# Patient Record
Sex: Female | Born: 1970 | Race: Black or African American | Hispanic: No | Smoking: Never smoker
Health system: Southern US, Community
[De-identification: ages and names within clinical notes are randomized; demographics above are authoritative.]

## PROBLEM LIST (undated history)

## (undated) DIAGNOSIS — C801 Malignant (primary) neoplasm, unspecified: Secondary | ICD-10-CM

## (undated) DIAGNOSIS — N951 Menopausal and female climacteric states: Secondary | ICD-10-CM

## (undated) DIAGNOSIS — G43909 Migraine, unspecified, not intractable, without status migrainosus: Secondary | ICD-10-CM

## (undated) HISTORY — PX: TONSILLECTOMY: SUR1361

## (undated) HISTORY — PX: BREAST SURGERY: SHX581

## (undated) HISTORY — DX: Migraine, unspecified, not intractable, without status migrainosus: G43.909

## (undated) HISTORY — DX: Menopausal and female climacteric states: N95.1

## (undated) HISTORY — PX: TUBAL LIGATION: SHX77

---

## 2002-09-06 HISTORY — PX: BUNIONECTOMY: SHX129

## 2014-11-21 ENCOUNTER — Encounter: Payer: Self-pay | Admitting: Emergency Medicine

## 2014-11-21 ENCOUNTER — Emergency Department (INDEPENDENT_AMBULATORY_CARE_PROVIDER_SITE_OTHER)
Admission: EM | Admit: 2014-11-21 | Discharge: 2014-11-21 | Disposition: A | Discharge status: Home / Self Care | Payer: BLUE CROSS/BLUE SHIELD | Source: Home / Self Care | Attending: Emergency Medicine | Admitting: Emergency Medicine

## 2014-11-21 DIAGNOSIS — R3589 Other polyuria: Secondary | ICD-10-CM

## 2014-11-21 DIAGNOSIS — N3001 Acute cystitis with hematuria: Secondary | ICD-10-CM

## 2014-11-21 DIAGNOSIS — R358 Other polyuria: Secondary | ICD-10-CM | POA: Diagnosis not present

## 2014-11-21 LAB — POCT URINALYSIS DIP (MANUAL ENTRY)
Bilirubin, UA: NEGATIVE
Glucose, UA: NEGATIVE
Ketones, POC UA: NEGATIVE
Nitrite, UA: NEGATIVE
Protein Ur, POC: NEGATIVE
Spec Grav, UA: 1.02
Urobilinogen, UA: 0.2
pH, UA: 5.5

## 2014-11-21 LAB — POCT URINE PREGNANCY: Preg Test, Ur: NEGATIVE

## 2014-11-21 MED ORDER — CIPROFLOXACIN HCL 500 MG PO TABS: 500.0000 mg | ORAL_TABLET | Freq: Two times a day (BID) | ORAL | Status: DC

## 2014-11-21 NOTE — ED Provider Notes (Signed)
CSN: [id number]     Arrival date & time 11/21/14  1956 History   None    Chief Complaint  Patient presents with  . Back Pain   (Consider location/radiation/quality/duration/timing/severity/associated sxs/prior Treatment) HPI This is a 44 y.o. female who presents today with UTI symptoms for 1 day  + Mild dysuria + Moderate to severe urinary frequency + urgency No hematuria No vaginal discharge No fever/chills Mild right flank and suprapubic pain. No nausea No vomiting No CVA back pain, but she feels a dull ache there.(Which is similar to when she's had prior UTIs) No fatigue She denies chance of pregnancy, as she is status post BTL, but she states LMP 10/10/2014. For the past 6 months, periods are somewhat irregular, about every 5-6 weeks.--- After discussion, She requests a urine pregnancy test today. Has tried over-the-counter measures without improvement.    History reviewed. No pertinent past medical history. Past Surgical History  Procedure Laterality Date  . Cesarean section     Family History  Problem Relation Age of Onset  . Hypertension Mother    History  Substance Use Topics  . Smoking status: Never Smoker   . Smokeless tobacco: Not on file  . Alcohol Use: No   OB History    No data available     Review of Systems  All other systems reviewed and are negative.   Allergies  Review of patient's allergies indicates no known allergies.  Home Medications   Prior to Admission medications   Medication Sig Start Date End Date Taking? Authorizing Provider  ciprofloxacin (CIPRO) 500 MG tablet Take 1 tablet (500 mg total) by mouth 2 (two) times daily. For 7 days 11/21/14   Jacqulyn Cane, MD   BP 147/88 mmHg  Pulse 85  Temp(Src) 98.5 F (36.9 C) (Oral)  Ht 5' (1.524 m)  Wt 159 lb (72.122 kg)  BMI 31.05 kg/m2  SpO2 100%  LMP 10/10/2014 Physical Exam  Constitutional: She is oriented to person, place, and time. She appears well-developed and well-nourished. No  distress.  HENT:  Mouth/Throat: Oropharynx is clear and moist.  Eyes: No scleral icterus.  Neck: Neck supple.  Cardiovascular: Normal rate, regular rhythm and normal heart sounds.   Pulmonary/Chest: Breath sounds normal.  Abdominal: Soft. She exhibits no mass. There is no hepatosplenomegaly. There is tenderness in the suprapubic area. There is no rebound, no guarding and no CVA tenderness.  Minimal right flank tenderness but no definite CVA tenderness  Lymphadenopathy:    She has no cervical adenopathy.  Neurological: She is alert and oriented to person, place, and time.  Skin: Skin is warm and dry.  Nursing note and vitals reviewed.   ED Course  Procedures (including critical care time) Labs Review Labs Reviewed  URINE CULTURE  POCT URINALYSIS DIP (MANUAL ENTRY)  POCT URINE PREGNANCY   Results for orders placed or performed during the hospital encounter of 11/21/14  POCT urinalysis dipstick (new)  Result Value Ref Range   Color, UA yellow    Clarity, UA clear    Glucose, UA neg    Bilirubin, UA negative    Bilirubin, UA negative    Spec Grav, UA 1.020    Blood, UA moderate    pH, UA 5.5    Protein Ur, POC negative    Urobilinogen, UA 0.2    Nitrite, UA Negative    Leukocytes, UA    POCT urine pregnancy  Result Value Ref Range   Preg Test, Ur Negative  MDM   1. Polyuria   2. Acute cystitis with hematuria    Urine pregnancy test negative. Clinically, no definite evidence for pyelonephritis.--But she has history of recurrent UTIs in the past. Treatment options discussed, as well as risks, benefits, alternatives. Patient voiced understanding and agreement with the following plans: Urine culture sent Cipro 500 mg twice a day 7 days Other symptomatic care discussed. Follow-up with your primary care doctor in 5-7 days if not improving, or sooner if symptoms become worse. She declined urology referral at this time, but advised to discuss with her PCP at recheck ,  to consider referral to urologist because of recurrent UTIs. Precautions discussed. Red flags discussed. Questions invited and answered. Patient voiced understanding and agreement.     Jacqulyn Cane, MD 11/21/14 2026

## 2014-11-21 NOTE — ED Notes (Signed)
Rt kidney pain since last night, polyuria, wants pregnancy test

## 2014-11-26 LAB — URINE CULTURE: Colony Count: 15000

## 2014-11-28 ENCOUNTER — Telehealth: Payer: Self-pay | Admitting: Emergency Medicine

## 2014-11-28 NOTE — ED Notes (Signed)
Inquired about patient's status; encourage them to call with questions/concerns.  

## 2014-12-18 ENCOUNTER — Ambulatory Visit: Payer: BLUE CROSS/BLUE SHIELD | Admitting: Physician Assistant

## 2015-09-25 ENCOUNTER — Ambulatory Visit (INDEPENDENT_AMBULATORY_CARE_PROVIDER_SITE_OTHER): Payer: BLUE CROSS/BLUE SHIELD

## 2015-09-25 ENCOUNTER — Encounter: Payer: Self-pay | Admitting: Osteopathic Medicine

## 2015-09-25 ENCOUNTER — Other Ambulatory Visit: Payer: Self-pay | Admitting: Osteopathic Medicine

## 2015-09-25 ENCOUNTER — Ambulatory Visit (INDEPENDENT_AMBULATORY_CARE_PROVIDER_SITE_OTHER): Payer: BLUE CROSS/BLUE SHIELD | Admitting: Osteopathic Medicine

## 2015-09-25 ENCOUNTER — Ambulatory Visit (HOSPITAL_BASED_OUTPATIENT_CLINIC_OR_DEPARTMENT_OTHER): Payer: BLUE CROSS/BLUE SHIELD

## 2015-09-25 VITALS — BP 136/84 | HR 71 | Ht 60.0 in | Wt 168.0 lb

## 2015-09-25 DIAGNOSIS — Z6832 Body mass index (BMI) 32.0-32.9, adult: Secondary | ICD-10-CM

## 2015-09-25 DIAGNOSIS — M25561 Pain in right knee: Secondary | ICD-10-CM | POA: Diagnosis not present

## 2015-09-25 DIAGNOSIS — M25551 Pain in right hip: Secondary | ICD-10-CM

## 2015-09-25 MED ORDER — MELOXICAM 7.5 MG PO TABS: 7.5000 mg | ORAL_TABLET | Freq: Two times a day (BID) | ORAL | Status: DC | PRN

## 2015-09-25 MED ORDER — PHENTERMINE HCL 30 MG PO CAPS: 30.0000 mg | ORAL_CAPSULE | ORAL | Status: DC

## 2015-09-25 NOTE — Patient Instructions (Signed)
We will get x-rays of the hip and knee today, we will call you with the results of these once they are reviewed by a radiologist. These results will determine further workup or treatment, if x-rays are looking okay we can send referral to physical therapy. You have also been prescribed an anti-inflammatory medication Mobic/meloxicam to take once to twice daily as needed.   We will request records from your previous doctors, may need to update some labs if you have not had routine screening done in some time. We can talk about this more at her next appointment.  Plan to follow-up in 4 weeks for weight check and blood pressure check on phentermine, will also recheck knee and hip at that time and discuss any other issues which may be troubling you.  Thanks for coming in today, please let us know if there is anything else we can do for your! -Dr. Loni Muse.

## 2015-09-25 NOTE — Progress Notes (Signed)
HPI: Diane Warren is a 45 y.o. female who presents to [facility] today for chief complaint of:  Chief Complaint  Patient presents with  . Establish Care    "Right groin and knee pain, help with weight loss"    Right knee and groin pain . Location: R knee and groin, hurts when pressing on knee . Quality: Soreness but occasionally sharp with certain movements . Duration: 4 - 5 months . Context: hurts with lifting leg, flexion of hip, it is also some tenderness to palpation knee. Can recall few months ago twisting knee going down steps but isn't sure if the pain started before after then. . Modifying factors: Advil, didn't help. Also took Tylenol 1000 mg w/ heating pad and this helped a bit more.  . Assoc signs/symptoms: No numbness or tingling, no lower back pain  WEIGHT LOSS   Counting calories, using FitBit    Past medical, social and family history reviewed: History reviewed. No pertinent past medical history. Past Surgical History  Procedure Laterality Date  . Cesarean section     Social History  Substance Use Topics  . Smoking status: Never Smoker   . Smokeless tobacco: Not on file  . Alcohol Use: No   Family History  Problem Relation Age of Onset  . Hypertension Mother     Current Outpatient Prescriptions  Medication Sig Dispense Refill  . LO LOESTRIN FE 1 MG-10 MCG / 10 MCG tablet     .   30 tablet 1  . Multiple Vitamin (THERA) TABS Take by mouth.    .       No current facility-administered medications for this visit.   No Known Allergies    Review of Systems: CONSTITUTIONAL:  No  fever, no chills, No  unintentional weight changes HEAD/EYES/EARS/NOSE/THROAT: No  headache, no vision change, no hearing change, No  sore throat, No  sinus pressure CARDIAC: No  chest pain, No  pressure, No palpitations, No  orthopnea RESPIRATORY: No  cough, No  shortness of breath/wheeze GASTROINTESTINAL: No  nausea, No  vomiting, No   abdominal pain, No  blood in stool, No  diarrhea, No  constipation  MUSCULOSKELETAL: No  myalgia/arthralgia GENITOURINARY: No  incontinence, No  abnormal genital bleeding/discharge SKIN: No  rash/wounds/concerning lesions HEM/ONC: No  easy bruising/bleeding, No  abnormal lymph node ENDOCRINE: No polyuria/polydipsia/polyphagia, No  heat/cold intolerance  NEUROLOGIC: No  weakness, No  dizziness, No  slurred speech PSYCHIATRIC: No  concerns with depression, No  concerns with anxiety, No sleep problems  Exam:  BP 136/84 mmHg  Pulse 71  Ht 5' (1.524 m)  Wt 168 lb (76.204 kg)  BMI 32.81 kg/m2 Constitutional: VS see above. General Appearance: alert, well-developed, well-nourished, NAD Eyes: Normal lids and conjunctive, non-icteric sclera, Ears, Nose, Mouth, Throat: MMM, Normal external inspection ears/nares/mouth/lips/gums, Respiratory: Normal respiratory effort. no wheeze, no rhonchi, no rales Cardiovascular: S1/S2 normal, no murmur, no rub/gallop auscultated. RRR.  Gastrointestinal: Nontender, no masses. Musculoskeletal: Gait normal. No clubbing/cyanosis of digits. No crepitus to flexion/extension of knee however there is some crepitus on manipulation of the patella on the right. Negative anterior and posterior drawer, negative varus valgus stress, negative McMurray's. Negative straight leg raise, positive pain with hip flexion abduction and external rotation (Patrick's test) Neurological: No cranial nerve deficit on limited exam. Motor and sensation intact and symmetric Skin: warm, dry, intact. No rash/ulcer. No concerning nevi or subq nodules on limited exam.   Psychiatric: Normal judgment/insight. Normal mood and affect.  Oriented x3.    ASSESSMENT/PLAN: We'll get x-rays as below, anti-inflammatory treatment with meloxicam, patient advised that we may need to get MRI for further evaluation cartilage/labral pathology, would certainly consider physical therapy as long as x-rays are looking  okay.  Right knee pain - Plan: meloxicam (MOBIC) 7.5 MG tablet, DG Knee Complete 4 Views Right  Right hip pain - Plan: meloxicam (MOBIC) 7.5 MG tablet, DG HIP UNILAT WITH PELVIS MIN 4 VIEWS RIGHT  BMI 32.0-32.9,adult - Plan: phentermine 30 MG capsule   Return in about 4 weeks (around 10/23/2015), or sooner if symptoms worsen or fail to improve, for WEIGHT AND BLOOD PRESSURE CHECK, RECHECK KNEE/HIP, DISCUSS OTHER ISSUES.

## 2015-10-07 ENCOUNTER — Encounter: Payer: Self-pay | Admitting: Osteopathic Medicine

## 2015-10-15 ENCOUNTER — Encounter: Payer: Self-pay | Admitting: Physical Therapy

## 2015-10-15 ENCOUNTER — Ambulatory Visit (INDEPENDENT_AMBULATORY_CARE_PROVIDER_SITE_OTHER): Payer: BLUE CROSS/BLUE SHIELD | Admitting: Physical Therapy

## 2015-10-15 DIAGNOSIS — R531 Weakness: Secondary | ICD-10-CM

## 2015-10-15 DIAGNOSIS — M25561 Pain in right knee: Secondary | ICD-10-CM

## 2015-10-15 NOTE — Patient Instructions (Signed)
Straight Leg Raise: With External Leg Rotation  [place] [phone]    Lie on back with right leg straight, opposite leg bent. Rotate straight leg out and lift _8-10___ inches. Repeat _8-10___ times per set. Do _2-3___ sets per session. Do _1___ sessions per day.  Strengthening: Hip Abduction (Side-Lying)    Tighten muscles on front of right thigh, then lift leg __12-14__ inches from surface, keeping knee locked.  Repeat _8-10___ times per set. Do _2-3___ sets per session. Do __1__ sessions per day.  Quads / HF, Prone    Lie face down, knees together. Grasp one ankle with same-side hand. Use towel if needed to reach. Gently pull foot toward buttock. Hold 30-45___ seconds. Repeat _1-2__ times per session. Do _1__ sessions per day. Repeat on the other side Copyright  V[employer]. All rights reserved.

## 2015-10-15 NOTE — Therapy (Signed)
[facility] [place], [place], [address] Phone: [phone]   Fax:  [phone]  Physical Therapy Evaluation  Patient Details  Name: Diane Warren MRN: [id number] Date of Birth: Sep 25, 1970 Referring Provider: Dr Emeterio Reeve  Encounter Date: 10/15/2015      PT End of Session - 10/15/15 0714    Visit Number 1   Number of Visits 8   Date for PT Re-Evaluation 11/12/15   PT Start Time 0714   PT Stop Time 0802   PT Time Calculation (min) 48 min   Activity Tolerance Patient limited by fatigue      History reviewed. No pertinent past medical history.  Past Surgical History  Procedure Laterality Date  . Cesarean section    . Tonsillectomy    . Bunionectomy Right 2004    There were no vitals filed for this visit.  Visit Diagnosis:  Right knee pain - Plan: PT plan of care cert/re-cert  Weakness generalized - Plan: PT plan of care cert/re-cert      Subjective Assessment - 10/15/15 0715    Subjective Pt reports she began having Rt knee pain about 6 months ago, insidious onset.  She thinks one day she had a small twist of knee coming down the steps.  Has been using alieve, elevatin, heat and ice with minimal resutlt.    Pertinent History sees a chiropractor as needed now for her neck and back.    How long can you sit comfortably? hasn't paid attention, however has to move knee around to get comfortable.    How long can you walk comfortably? tolerates 5-10000 steps a day with her fitbit.  However with 10000 needs medication    Diagnostic tests x-rays negative   Patient Stated Goals plays sports, rid of pain, take kick boxing class    Currently in Pain? Yes   Pain Score 5    Pain Location Knee   Pain Orientation Right   Pain Descriptors / Indicators Dull;Aching;Sharp   Pain Type Acute pain   Pain Radiating Towards up into the groin   Pain Onset More than a month ago   Pain Frequency Constant   Aggravating Factors  up/down stairs, moving funny   Pain Relieving Factors medication            [facility] PT Assessment - 10/15/15 0001    Assessment   Medical Diagnosis Rt knee pain   Referring Provider Dr Emeterio Reeve   Onset Date/Surgical Date 07/15/15   Hand Dominance Right   Next MD Visit 11/20/15   Prior Therapy not for this   Precautions   Precautions None   Balance Screen   Has the patient fallen in the past 6 months No   Has the patient had a decrease in activity level because of a fear of falling?  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, desk work and car travel   Leisure sports - basketball, ski    Observation/Other Assessments   Focus on Therapeutic Outcomes (FOTO)  54% limited   Functional Tests   Functional tests Squat;Single leg stance   Squat   Comments shifts to the Lt and knees in front of toes   Single Leg Stance   Comments Lt WNL, Rt 8 sec  with poor quad control   Posture/Postural Control   Posture Comments pes planus bilat   ROM / Strength   AROM / PROM / Strength AROM;Strength   AROM   AROM Assessment Site Knee   Right/Left Knee Right;Left  lateral tracking   Right Knee Extension 0   Right Knee Flexion 126   Left Knee Extension 0   Left Knee Flexion 128   Strength   Strength Assessment Site Hip;Knee;Ankle   Right/Left Hip Right;Left   Right Hip Flexion 4/5   Right Hip Extension 4/5   Right Hip ABduction 3+/5   Left Hip Flexion 5/5   Left Hip Extension 5/5   Left Hip ABduction 5/5   Right/Left Knee Right  Lt WNL   Right Knee Flexion 4/5  with pain   Right Knee Extension 4+/5  pain   Flexibility   Soft Tissue Assessment /Muscle Length yes   Quadriceps Lt 5" from buttocks in prone, 6" on Rt with pain   Palpation   Palpation  comment tight in Rt posterior knee, bilat HS,   tight and very tender in Rt thigh adductors   Special Tests    Special Tests Meniscus Tests   Meniscus Tests --  (+) Ege's test and medial jt line tenderness Rt knee                   [facility] Adult PT Treatment/Exercise - 10/15/15 0001    Exercises   Exercises Knee/Hip   Knee/Hip Exercises: Stretches   Sports administrator Both;1 rep;30 seconds  prone with strap   Knee/Hip Exercises: Supine   Straight Leg Raise with External Rotation Strengthening;Right;1 set  8 reps, had pain and fatigue   Knee/Hip Exercises: Sidelying   Hip ABduction Strengthening;Right;1 set;10 reps  with fatigue   Manual Therapy   Manual Therapy Taping   Manual therapy comments dynamic tape Rt knee for lateral tracking.                 PT Education - 10/15/15 1001    Education provided Yes   Education Details HEP and taping benefits   Person(s) Educated Patient   Methods Explanation;Demonstration;Handout   Comprehension Returned demonstration;Verbalized understanding             PT Long Term Goals - 10/15/15 0711    PT LONG TERM GOAL #1   Title I with advanced HEP ( 11/12/15)    Time 4   Period Weeks   Status New   PT LONG TERM GOAL #2   Title report pain reduction in Rt knee =/> 75% limited ( 11/12/15)    Time 4   Period Weeks   Status New   PT LONG TERM GOAL #3   Title increase strength Rt LE =/> 5-/5 through out to allow to go up/down stairs without rail assist ( 11/12/15)    Time 4   Period Weeks   Status New   PT LONG TERM GOAL #4   Title increase prone quad flexibily, heels 2" from buttocks to allow her increased movement to perform kick boxing movements (11/12/15)    Time 4   Period Weeks   Status New   PT LONG TERM GOAL #5   Title improve FOTO =/< 35% limited (11/12/15)    Time 4   Period Weeks   Status New               Plan - 10/15/15 1013    Clinical Impression Statement 45 yo  female presents with 3-4 month h/o  Rt knee pain that is not improving.  She has significant weakness in her Rt hip and some weakness in her thigh. Patient fatigues quickly with strengthening exercise.  [name] also has lateral tracking patellas with tight quads. These deficits are limiting her function and causing pain.    Pt will benefit from skilled therapeutic intervention in order to improve on the following deficits Postural dysfunction;Decreased strength;Impaired flexibility;Pain   Rehab Potential Good   PT Frequency 2x / week   PT Duration 4 weeks   PT Treatment/Interventions Ultrasound;Neuromuscular re-education;Patient/family education;Dry needling;Stair training;Cryotherapy;Electrical Stimulation;Taping;Moist Heat;Therapeutic exercise;Manual techniques   PT Next Visit Plan progress strengthening , retape as needed.    Consulted and Agree with Plan of Care Patient         Problem List Patient Active Problem List   Diagnosis Date Noted  . Right knee pain 09/25/2015  . Right hip pain 09/25/2015  . BMI 32.0-32.9,adult 09/25/2015    Jeral Pinch PT 10/15/2015, 10:16 AM  [facility] [place] [address] [address] [place], [place], [address] Phone: [phone]   Fax:  [phone]  Name: Diane Warren MRN: [id number] Date of Birth: 02/01/1971

## 2015-10-20 ENCOUNTER — Ambulatory Visit (INDEPENDENT_AMBULATORY_CARE_PROVIDER_SITE_OTHER): Payer: BLUE CROSS/BLUE SHIELD | Admitting: Physical Therapy

## 2015-10-20 DIAGNOSIS — R531 Weakness: Secondary | ICD-10-CM | POA: Diagnosis not present

## 2015-10-20 DIAGNOSIS — M25561 Pain in right knee: Secondary | ICD-10-CM

## 2015-10-20 NOTE — Therapy (Signed)
[place], [place], [address] Phone: [phone]   Fax:  [phone]  Physical Therapy Treatment  Patient Details  Name: Diane Warren MRN: [id number] Date of Birth: [birth year] Referring Provider: Dr. Sheppard Coil  Encounter Date: 10/20/2015      PT End of Session - 10/20/15 1528    Visit Number 2   Number of Visits 8   Date for PT Re-Evaluation 11/12/15   PT Start Time [id number]   PT Stop Time 1618   PT Time Calculation (min) 57 min      No past medical history on file.  Past Surgical History  Procedure Laterality Date  . Cesarean section    . Tonsillectomy    . Bunionectomy Right 2004    There were no vitals filed for this visit.  Visit Diagnosis:  Right knee pain  Weakness generalized      Subjective Assessment - 10/20/15 1524    Subjective Pt reports she went to a hair convention 2 days ago; lot of walking (over 15,000 steps).  Steps are still difficult. Continues to use Alieve/ tylenol, ice, elevating leg. "I have done the exercises. They are hard" Pt reports the tape has helped; not throbbing at night - reporting improved sleep.    Currently in Pain? Yes   Pain Score 5    Pain Location Knee   Pain Orientation Right   Pain Descriptors / Indicators Dull   Aggravating Factors  up/dow stairs; prolonged standing    Pain Relieving Factors elevated LE.             [facility] PT Assessment - 10/20/15 0001    Assessment   Medical Diagnosis Rt knee pain   Referring Provider Dr. Sheppard Coil   Onset Date/Surgical Date 07/15/15   Hand Dominance Right   Next MD Visit 11/20/15   Prior Therapy not for this           [facility] Adult PT Treatment/Exercise - 10/20/15 0001    Knee/Hip Exercises: Stretches   Passive Hamstring Stretch Right;2 reps;30 seconds   Quad Stretch Right;3 reps;30 seconds   Other Knee/Hip Stretches Standing Lt side lunge for Rt adductor x 4 reps x 20 sec    Other Knee/Hip  Stretches Trial of Rt adductor stretch with strap (guarded); switched to supported butterfly stretch.    Knee/Hip Exercises: Aerobic   Nustep L4: 6 min    Knee/Hip Exercises: Supine   Straight Leg Raise with External Rotation Strengthening;Right;1 set;5 reps   Knee/Hip Exercises: Sidelying   Hip ABduction Strengthening;Right;2 sets;5 reps   Clams Rt: 2 sets of 5 reps (challenging)    Modalities   Modalities Electrical Stimulation;Moist Heat   Moist Heat Therapy   Number Minutes Moist Heat 15 Minutes   Moist Heat Location Knee  Rt thigh   Electrical Stimulation   Electrical Stimulation Location Rt medial knee and adductor    Electrical Stimulation Action IFC    Electrical Stimulation Parameters to tolerance    Electrical Stimulation Goals Pain   Manual Therapy   Manual Therapy Soft tissue mobilization;Taping   Manual therapy comments dynamic tape Rt knee for lateral tracking.    Soft tissue mobilization to Rt adductor group in supine.                      PT Long Term Goals - 10/15/15 [id number]    PT LONG TERM GOAL #1   Title I with advanced HEP ( 11/12/15)  Time 4   Period Weeks   Status New   PT LONG TERM GOAL #2   Title report pain reduction in Rt knee =/> 75% limited ( 11/12/15)    Time 4   Period Weeks   Status New   PT LONG TERM GOAL #3   Title increase strength Rt LE =/> 5-/5 through out to allow to go up/down stairs without rail assist ( 11/12/15)    Time 4   Period Weeks   Status New   PT LONG TERM GOAL #4   Title increase prone quad flexibily, heels 2" from buttocks to allow her increased movement to perform kick boxing movements (11/12/15)    Time 4   Period Weeks   Status New   PT LONG TERM GOAL #5   Title improve FOTO =/< 35% limited (11/12/15)    Time 4   Period Weeks   Status New               Plan - 10/20/15 1653    Clinical Impression Statement Pt had difficulty completing SLR due to tightness/ pain in Rt adductors.  Adductors tight and  tender with manual therapy.  Pt reported decreased pain in knee and Rt adductors after MHP/ estim and retape.     Pt will benefit from skilled therapeutic intervention in order to improve on the following deficits Postural dysfunction;Decreased strength;Impaired flexibility;Pain   Rehab Potential Good   PT Frequency 2x / week   PT Duration 4 weeks   PT Treatment/Interventions Ultrasound;Neuromuscular re-education;Patient/family education;Dry needling;Stair training;Cryotherapy;Electrical Stimulation;Taping;Moist Heat;Therapeutic exercise;Manual techniques   PT Next Visit Plan progress strengthening , retape as needed.      Consulted and Agree with Plan of Care Patient        Problem List Patient Active Problem List   Diagnosis Date Noted  . Right knee pain 09/25/2015  . Right hip pain 09/25/2015  . BMI 32.0-32.9,adult 09/25/2015    Kerin Perna, PTA 10/20/2015 4:58 PM   [place], [place], [address] Phone: [phone]   Fax:  [phone]  Name: Diane Warren MRN: [id number] Date of Birth: Oct 06, 1970

## 2015-10-22 ENCOUNTER — Ambulatory Visit (INDEPENDENT_AMBULATORY_CARE_PROVIDER_SITE_OTHER): Payer: BLUE CROSS/BLUE SHIELD | Admitting: Physical Therapy

## 2015-10-22 ENCOUNTER — Encounter: Payer: Self-pay | Admitting: Physical Therapy

## 2015-10-22 DIAGNOSIS — M25561 Pain in right knee: Secondary | ICD-10-CM

## 2015-10-22 DIAGNOSIS — R531 Weakness: Secondary | ICD-10-CM

## 2015-10-22 NOTE — Patient Instructions (Signed)

## 2015-10-22 NOTE — Therapy (Signed)
[place], [place], [address] Phone: [phone]   Fax:  [phone]  Physical Therapy Treatment  Patient Details  Name: Diane Warren MRN: [id number] Date of Birth: 10/28/1970 Referring Provider: Dr. Sheppard Coil  Encounter Date: 10/22/2015      PT End of Session - 10/22/15 0708    Visit Number 3   Number of Visits 8   Date for PT Re-Evaluation 11/12/15   PT Start Time 0703   PT Stop Time 0750   PT Time Calculation (min) 47 min   Activity Tolerance Patient tolerated treatment well      History reviewed. No pertinent past medical history.  Past Surgical History  Procedure Laterality Date  . Cesarean section    . Tonsillectomy    . Bunionectomy Right 2004    There were no vitals filed for this visit.  Visit Diagnosis:  Right knee pain  Weakness generalized      Subjective Assessment - 10/22/15 0708    Subjective Pt states she is doing better, she felt like her pain was better after the last visit.  She is trying hard to stretch out her inner thigh.    Currently in Pain? Yes   Pain Score 4    Pain Location Knee   Pain Orientation Right   Pain Descriptors / Indicators Dull   Pain Type Acute pain   Pain Radiating Towards up inner thigh   Aggravating Factors  with certain movements   Pain Relieving Factors elevation            [facility] PT Assessment - 10/22/15 0001    Assessment   Medical Diagnosis Rt knee pain   Onset Date/Surgical Date 07/15/15   Hand Dominance Right   Next MD Visit 11/04/15                     [facility] Adult PT Treatment/Exercise - 10/22/15 0001    Knee/Hip Exercises: Stretches   Sports administrator Right;3 reps;30 seconds  prone with strap   Other Knee/Hip Stretches Rt hip adductor stretch with strap   Modalities   Modalities Electrical Stimulation;Moist Heat   Moist Heat Therapy   Number Minutes Moist Heat 15 Minutes   Moist Heat Location Knee   Rt and inner thigh   Electrical Stimulation   Electrical Stimulation Location Rt medial knee and adductor    Electrical Stimulation Action IFC    Electrical Stimulation Parameters to tolerance   Electrical Stimulation Goals Pain;Tone   Manual Therapy   Manual Therapy Soft tissue mobilization;Taping   Manual therapy comments dynamic tape Rt knee for lateral tracking.    Soft tissue mobilization to Rt adductor group in supine.           Trigger Point Dry Needling - 10/22/15 3710    Consent Given? Yes   Education Handout Provided Yes   Muscles Treated Lower Body Adductor longus/brevius/maximus   Adductor Response Twitch response elicited;Palpable increased muscle length                   PT Long Term Goals - 10/22/15 0719    PT LONG TERM GOAL #1   Title I with advanced HEP ( 11/12/15)    Time 4   Period Weeks   Status On-going   PT LONG TERM GOAL #2   Title report pain reduction in Rt knee =/> 75% limited ( 11/12/15)    Time 4   Period Weeks  Status On-going  20% imporvement   PT LONG TERM GOAL #3   Title increase strength Rt LE =/> 5-/5 through out to allow to go up/down stairs without rail assist ( 11/12/15)    Time 4   Period Weeks   Status On-going   PT LONG TERM GOAL #4   Title increase prone quad flexibily, heels 2" from buttocks to allow her increased movement to perform kick boxing movements (11/12/15)    Time 4   Period Weeks   Status Achieved   PT LONG TERM GOAL #5   Title improve FOTO =/< 35% limited (11/12/15)    Time 4   Period Weeks   Status On-going               Plan - 10/22/15 0736    Clinical Impression Statement Pt is making slow progress, responded well to TDN, she has met a goal. Had increased flexibility after treatment    Pt will benefit from skilled therapeutic intervention in order to improve on the following deficits Postural dysfunction;Decreased strength;Impaired flexibility;Pain   Rehab Potential Good   PT Frequency 2x /  week   PT Duration 4 weeks   PT Treatment/Interventions Ultrasound;Neuromuscular re-education;Patient/family education;Dry needling;Stair training;Cryotherapy;Electrical Stimulation;Taping;Moist Heat;Therapeutic exercise;Manual techniques   PT Next Visit Plan assess response to TDN   Consulted and Agree with Plan of Care Patient        Problem List Patient Active Problem List   Diagnosis Date Noted  . Right knee pain 09/25/2015  . Right hip pain 09/25/2015  . BMI 32.0-32.9,adult 09/25/2015    Jeral Pinch PT 10/22/2015, 9:33 AM  [facility] [place], [place], [address] Phone: [phone]   Fax:  [phone]  Name: Aysa Larivee MRN: [id number] Date of Birth: [birth year]

## 2015-10-23 ENCOUNTER — Ambulatory Visit: Payer: BLUE CROSS/BLUE SHIELD | Admitting: Osteopathic Medicine

## 2015-10-27 ENCOUNTER — Encounter: Payer: BLUE CROSS/BLUE SHIELD | Admitting: Physical Therapy

## 2015-10-28 ENCOUNTER — Telehealth: Payer: Self-pay | Admitting: *Deleted

## 2015-10-28 NOTE — Telephone Encounter (Signed)
Patient called stating she is " having sinus and allergy issues."  She states she has congestion and runny nose. She wants to know what she can take since for this while taking phentermine

## 2015-10-28 NOTE — Telephone Encounter (Signed)
Would avoid ibuprofen or other NSAIDs such as Aleve, would also avoid decongestants such as Sudafed. Fine to take antihistamine such as Benadryl or Claritin, can also use nasal sprays cleaning Flonase or a few days of Afrin. Tylenol is fine to use for aches/pains and congestion pain.

## 2015-10-29 ENCOUNTER — Ambulatory Visit (INDEPENDENT_AMBULATORY_CARE_PROVIDER_SITE_OTHER): Payer: BLUE CROSS/BLUE SHIELD | Admitting: Physical Therapy

## 2015-10-29 ENCOUNTER — Encounter: Payer: Self-pay | Admitting: Physical Therapy

## 2015-10-29 DIAGNOSIS — R531 Weakness: Secondary | ICD-10-CM

## 2015-10-29 DIAGNOSIS — M25561 Pain in right knee: Secondary | ICD-10-CM | POA: Diagnosis not present

## 2015-10-29 NOTE — Telephone Encounter (Signed)
Left message on patient's voicemail.

## 2015-10-29 NOTE — Therapy (Signed)
[place], [place], [address] Phone: [phone]   Fax:  [phone]  Physical Therapy Treatment  Patient Details  Name: Diane Warren MRN: [id number] Date of Birth: 1970-11-04 Referring Provider: Dr. Sheppard Coil  Encounter Date: 10/29/2015      PT End of Session - 10/29/15 0701    Visit Number 4   Number of Visits 8   Date for PT Re-Evaluation 11/12/15   PT Start Time 0701   PT Stop Time 0752   PT Time Calculation (min) 51 min   Activity Tolerance Patient limited by pain      History reviewed. No pertinent past medical history.  Past Surgical History  Procedure Laterality Date  . Cesarean section    . Tonsillectomy    . Bunionectomy Right 2004    There were no vitals filed for this visit.  Visit Diagnosis:  Right knee pain  Weakness generalized      Subjective Assessment - 10/29/15 0702    Subjective Pt reports her allergies are really  bad right now and have her feeling awful. The knee feels a lot better, was sore after the needling.    Currently in Pain? No/denies   Pain Frequency Intermittent   Aggravating Factors  with turning motions and down steps   Pain Relieving Factors tape and treatment.                          [facility] Adult PT Treatment/Exercise - 10/29/15 0001    Knee/Hip Exercises: Stretches   Passive Hamstring Stretch Right;30 seconds   Other Knee/Hip Stretches Rt hip adductor stretch with strap   Other Knee/Hip Stretches Rt hip abductor stretch with strap   Knee/Hip Exercises: Aerobic   Nustep L4: 6 min    Knee/Hip Exercises: Standing   Step Down Right;2 sets;10 reps;Step Height: 8"  with HHA on rails   Knee/Hip Exercises: Sidelying   Hip ADduction Strengthening;Right;2 sets;10 reps   Hip ADduction Limitations then supported long sit Rt hip add with    Clams reverse clams no wt, attempted 2# had medial knee pain.    Other Sidelying Knee/Hip  Exercises 2x10 pilates kicks/pulses FWD/BWD   Modalities   Modalities Electrical Stimulation;Moist Heat   Moist Heat Therapy   Number Minutes Moist Heat 15 Minutes   Moist Heat Location --  Rt hip adductors and quad   Electrical Stimulation   Electrical Stimulation Location Rt hip adductors   Electrical Stimulation Action IFC   Electrical Stimulation Parameters to tolerance   Electrical Stimulation Goals Pain;Tone   Manual Therapy   Manual therapy comments dynamic tape Rt knee for lateral tracking.                      PT Long Term Goals - 10/29/15 [id number]    PT LONG TERM GOAL #1   Title I with advanced HEP ( 11/12/15)    Status On-going   PT LONG TERM GOAL #2   Title report pain reduction in Rt knee =/> 75% limited ( 11/12/15)    Status Achieved  75% improvement   PT LONG TERM GOAL #3   Title increase strength Rt LE =/> 5-/5 through out to allow to go up/down stairs without rail assist ( 11/12/15)    Status On-going  still difficulty with stairs   PT LONG TERM GOAL #4   Title increase prone quad flexibily, heels 2" from buttocks to  allow her increased movement to perform kick boxing movements (11/12/15)    Status Achieved   PT LONG TERM GOAL #5   Title improve FOTO =/< 35% limited (11/12/15)    Status On-going               Plan - 10/29/15 0736    Clinical Impression Statement [place] continues to make progress, she has a lot of relief with the dynamic tape.  Hip adductors continue to give her some discomfort and interfere with her ability to perform tasks.  She also still has medial knee pain with twisting and bending activities.  This pain is no longer constant. Her Rt hip/knee fatigue quickly with exercise.    Pt will benefit from skilled therapeutic intervention in order to improve on the following deficits Postural dysfunction;Decreased strength;Impaired flexibility;Pain   Rehab Potential Good   PT Frequency 2x / week   PT Duration 4 weeks   PT  Treatment/Interventions Ultrasound;Neuromuscular re-education;Patient/family education;Dry needling;Stair training;Cryotherapy;Electrical Stimulation;Taping;Moist Heat;Therapeutic exercise;Manual techniques   PT Next Visit Plan STW to Rt hip adductors.  She sees the MD next Tuesday.    Consulted and Agree with Plan of Care Patient        Problem List Patient Active Problem List   Diagnosis Date Noted  . Right knee pain 09/25/2015  . Right hip pain 09/25/2015  . BMI 32.0-32.9,adult 09/25/2015    Jeral Pinch PT 10/29/2015, 7:54 AM  [facility] [place], [place], [address] Phone: [phone]   Fax:  [phone]  Name: Diane Warren MRN: [id number] Date of Birth: [birth year]

## 2015-10-30 ENCOUNTER — Encounter: Payer: Self-pay | Admitting: Physical Therapy

## 2015-10-30 ENCOUNTER — Ambulatory Visit (INDEPENDENT_AMBULATORY_CARE_PROVIDER_SITE_OTHER): Payer: BLUE CROSS/BLUE SHIELD | Admitting: Physical Therapy

## 2015-10-30 DIAGNOSIS — R531 Weakness: Secondary | ICD-10-CM | POA: Diagnosis not present

## 2015-10-30 DIAGNOSIS — M25561 Pain in right knee: Secondary | ICD-10-CM

## 2015-10-30 NOTE — Therapy (Addendum)
[place], [place], [address] Phone: [phone]   Fax:  [phone]  Physical Therapy Treatment  Patient Details  Name: Diane Warren MRN: [id number] Date of Birth: 30-Aug-1971 Referring Provider: Dr. Sheppard Coil  Encounter Date: 10/30/2015      PT End of Session - 10/30/15 1419    Visit Number 5   Number of Visits 8   Date for PT Re-Evaluation 11/12/15   PT Start Time 1416  pt in late   PT Stop Time 1458   PT Time Calculation (min) 42 min   Activity Tolerance Patient limited by pain      History reviewed. No pertinent past medical history.  Past Surgical History  Procedure Laterality Date  . Cesarean section    . Tonsillectomy    . Bunionectomy Right 2004    There were no vitals filed for this visit.  Visit Diagnosis:  Right knee pain  Weakness generalized      Subjective Assessment - 10/30/15 1418    Subjective Arrietty states she is feeling a little bit better as relates to her allergies,  Her knee is about the same. Has some sensation in the Rt groin pinching sensatoin.    Currently in Pain? No/denies when at rest                         [facility] Adult PT Treatment/Exercise - 10/30/15 0001    Knee/Hip Exercises: Aerobic   Recumbent Bike L2x5'  stopped due to catching pain in her Rt groin Hip adduction stretches   Modalities   Modalities Moist Heat;Ultrasound   Moist Heat Therapy   Number Minutes Moist Heat 15 Minutes   Moist Heat Location --  rt hip adductors   Ultrasound   Ultrasound Location Rt hip proximal adductors   Ultrasound Parameters 100%, 1.97mz,1.5w/cm2   Ultrasound Goals Pain  tightness   Manual Therapy   Manual Therapy Soft tissue mobilization;Joint mobilization   Joint Mobilization Rt hip posterior mobs - pain when femur is slightly adducted and abducted.    Soft tissue mobilization Rt hip adductors TPR                     PT  Long Term Goals - 10/29/15 0708    PT LONG TERM GOAL #1   Title I with advanced HEP ( 11/12/15)    Status On-going   PT LONG TERM GOAL #2   Title report pain reduction in Rt knee =/> 75% limited ( 11/12/15)    Status Achieved  75% improvement   PT LONG TERM GOAL #3   Title increase strength Rt LE =/> 5-/5 through out to allow to go up/down stairs without rail assist ( 11/12/15)    Status On-going  still difficulty with stairs   PT LONG TERM GOAL #4   Title increase prone quad flexibily, heels 2" from buttocks to allow her increased movement to perform kick boxing movements (11/12/15)    Status Achieved   PT LONG TERM GOAL #5   Title improve FOTO =/< 35% limited (11/12/15)    Status On-going               Plan - 10/30/15 1446    Clinical Impression Statement Pt has had a flare up of Rt hip adductor muscles at the proximal tendinous muscular junction.  The pain was grabbing in nature and limited her ability to move until it settled down.  I have concerns of possible micro tears vs muscle tighness there and possible medial meniscus injury.     Pt will benefit from skilled therapeutic intervention in order to improve on the following deficits Postural dysfunction;Decreased strength;Impaired flexibility;Pain   Rehab Potential Good   PT Frequency 2x / week   PT Duration 4 weeks   PT Treatment/Interventions Ultrasound;Neuromuscular re-education;Patient/family education;Dry needling;Stair training;Cryotherapy;Electrical Stimulation;Taping;Moist Heat;Therapeutic exercise;Manual techniques   PT Next Visit Plan FOTO - and send note to MD has an appointment on Tuesday   Consulted and Agree with Plan of Care Patient        Problem List Patient Active Problem List   Diagnosis Date Noted  . Right knee pain 09/25/2015  . Right hip pain 09/25/2015  . BMI 32.0-32.9,adult 09/25/2015    Jeral Pinch PT 10/30/2015, 2:50 PM  [facility] [place], [place], [address] Phone: [phone]   Fax:  [phone]  Name: Dawnn Nam MRN: [id number] Date of Birth: 07-07-1971    PHYSICAL THERAPY DISCHARGE SUMMARY  Visits from Start of Care: 5 Current functional level related to goals / functional outcomes: unknown  Remaining deficits: unknown   Education / Equipment: HEP Plan:                                                    Patient goals were partially met. Patient is being discharged due to not returning since the last visit.  ?????    Jeral Pinch, PT 12/03/2015 7:26 AM

## 2015-11-03 ENCOUNTER — Encounter: Payer: Self-pay | Admitting: Rehabilitative and Restorative Service Providers"

## 2015-11-04 ENCOUNTER — Ambulatory Visit: Payer: BLUE CROSS/BLUE SHIELD | Admitting: Osteopathic Medicine

## 2015-11-05 ENCOUNTER — Ambulatory Visit (INDEPENDENT_AMBULATORY_CARE_PROVIDER_SITE_OTHER): Payer: BLUE CROSS/BLUE SHIELD | Admitting: Osteopathic Medicine

## 2015-11-05 ENCOUNTER — Encounter: Payer: Self-pay | Admitting: Osteopathic Medicine

## 2015-11-05 ENCOUNTER — Encounter: Payer: BLUE CROSS/BLUE SHIELD | Admitting: Physical Therapy

## 2015-11-05 VITALS — BP 135/85 | HR 82 | Ht 60.0 in | Wt 161.0 lb

## 2015-11-05 DIAGNOSIS — Z6831 Body mass index (BMI) 31.0-31.9, adult: Secondary | ICD-10-CM

## 2015-11-05 DIAGNOSIS — E669 Obesity, unspecified: Secondary | ICD-10-CM

## 2015-11-05 MED ORDER — PHENTERMINE HCL 30 MG PO CAPS: 30.0000 mg | ORAL_CAPSULE | ORAL | Status: DC

## 2015-11-05 NOTE — Progress Notes (Signed)
HPI: Diane Warren is a 45 y.o. female who presents to [facility] today for chief complaint of:  Chief Complaint  Patient presents with  . Follow-up    weight and blood pressure     WEIGHT LOSS   Counting calories, using FitBit, started on phentermine last visit. Is 161 today and was 168 on her visit 09/25/15. Doing well on medicines. BP mild leevated but better on recheck, pt states she has been checking it at [facility] and shows me notes where she is keeping track of it, systolic [id number], diastolic [id number] she was rushing to get to this appointment and admists stresses out.      Past medical, social and family history reviewed: No past medical history on file. Past Surgical History  Procedure Laterality Date  . Cesarean section    . Tonsillectomy    . Bunionectomy Right 2004   Social History  Substance Use Topics  . Smoking status: Never Smoker   . Smokeless tobacco: Not on file  . Alcohol Use: No   Family History  Problem Relation Age of Onset  . Hypertension Mother   . Hyperlipidemia Mother   . Cancer Maternal Uncle     LUNG  . Cancer Paternal Uncle     SKIN  . Cancer Maternal Grandmother     BREAST  . Cancer Paternal Grandmother     BREAST  . Diabetes Paternal Grandmother    Outpatient Encounter Prescriptions as of 11/05/2015  Medication Sig Note  . LO LOESTRIN FE 1 MG-10 MCG / 10 MCG tablet Reported on 10/15/2015 09/25/2015: Received from: External Pharmacy  . meloxicam (MOBIC) 7.5 MG tablet Take 1 tablet (7.5 mg total) by mouth 2 (two) times daily as needed for pain. (Patient not taking: Reported on 10/15/2015)   . Multiple Vitamin (THERA) TABS Take by mouth. 09/25/2015: Received from: [facility]  . phentermine 30 MG capsule Take 1 capsule (30 mg total) by mouth every morning.    No facility-administered encounter medications on file as of 11/05/2015.     No Known Allergies    Review of Systems: CONSTITUTIONAL:  No  fever, no chills,  No  unintentional weight changes CARDIAC: No  chest pain, No  pressure, No palpitations, No  orthopnea RESPIRATORY: No  cough, No  shortness of breath/wheeze NEUROLOGIC: No  weakness, No  dizziness  Exam:  BP 135/85 mmHg  Pulse 82  Ht 5' (1.524 m)  Wt 161 lb (73.029 kg)  BMI 31.44 kg/m2 Constitutional: VS see above. General Appearance: alert, well-developed, well-nourished, NAD Eyes: Normal lids and conjunctive, non-icteric sclera, Respiratory: Normal respiratory effort. no wheeze, no rhonchi, no rales Cardiovascular: S1/S2 normal, no murmur, no rub/gallop auscultated. RRR.  Neurological: No cranial nerve deficit on limited exam.     ASSESSMENT/PLAN: Continue current dose phentermine, ok with progress so far, pt encouraged continue to keep track of BP and RTC for recheck 1 month, ER/RTC precautions reviewed. Advised we will eventually bring her off the phentermine after 4-5 months of therapy and transition to long-term treatment if she would like to do that, but this medicine is not approved for long-term use. Pt verbalizes understanding.   Obesity  BMI 32.0-32.9,adult - Plan: phentermine 30 MG capsule   Return in about 4 weeks (around 12/03/2015) for NURSE VISIT - bp and weight check on Phentermine.

## 2015-12-03 ENCOUNTER — Ambulatory Visit (INDEPENDENT_AMBULATORY_CARE_PROVIDER_SITE_OTHER): Payer: BLUE CROSS/BLUE SHIELD | Admitting: Osteopathic Medicine

## 2015-12-03 VITALS — BP 137/92 | HR 85 | Wt 161.0 lb

## 2015-12-03 DIAGNOSIS — Z6831 Body mass index (BMI) 31.0-31.9, adult: Secondary | ICD-10-CM

## 2015-12-03 DIAGNOSIS — J302 Other seasonal allergic rhinitis: Secondary | ICD-10-CM | POA: Diagnosis not present

## 2015-12-03 MED ORDER — OLOPATADINE HCL 0.6 % NA SOLN: NASAL | Status: DC

## 2015-12-03 MED ORDER — PHENTERMINE HCL 37.5 MG PO CAPS: 37.5000 mg | ORAL_CAPSULE | ORAL | Status: DC

## 2015-12-03 NOTE — Progress Notes (Signed)
BP 137/92 mmHg  Pulse 85  Wt 161 lb (73.029 kg) Weight same as 4 weeks ago Weight 2 months ago 168 7 lb weight loss 5% weight loss would be 8.4 lbs  Can increase Phentermine dose, if still not losing weight by next nurse visit in 4 weeks, would need to schedule with me to discuss alternative medicines or other factors which may be affecting her weight loss progress.   As far as allergies, I can call in alternative prescription nasal spray, can continue current oral meds, let me know if still no effect.   Thanks!

## 2015-12-03 NOTE — Progress Notes (Signed)
Patient is here for blood pressure and weight check. Denies any trouble sleeping, palpitations, or any other medication problems. Patient does report she is still having some trouble with her allergies. States she got OTC flonase and Claritin but is still symptomatic. Questions if PCP will sent over something prescription strength. Advised I will route to PCP for review. Patient has remained at the sme weight. A refill for Phentermine will be sent to Provider for review. Patient advised, if new Rx is sent in, to schedule a four week nurse visit and keep her upcoming appointment with her PCP. Verbalized understanding, no further questions.

## 2015-12-31 ENCOUNTER — Ambulatory Visit (INDEPENDENT_AMBULATORY_CARE_PROVIDER_SITE_OTHER): Payer: BLUE CROSS/BLUE SHIELD | Admitting: Osteopathic Medicine

## 2015-12-31 VITALS — BP 137/94 | HR 96 | Wt 156.0 lb

## 2015-12-31 DIAGNOSIS — E669 Obesity, unspecified: Secondary | ICD-10-CM | POA: Insufficient documentation

## 2015-12-31 DIAGNOSIS — R635 Abnormal weight gain: Secondary | ICD-10-CM

## 2015-12-31 DIAGNOSIS — Z6831 Body mass index (BMI) 31.0-31.9, adult: Secondary | ICD-10-CM | POA: Diagnosis not present

## 2015-12-31 MED ORDER — PHENTERMINE HCL 37.5 MG PO CAPS: 37.5000 mg | ORAL_CAPSULE | ORAL | Status: DC

## 2015-12-31 NOTE — Progress Notes (Signed)
Diane Warren is here for blood pressure and weight check. Diet and exercise is going well. Denies trouble sleeping or palpitations.    [name] reports a new onset of uterine cancer in her mom. Updated family history.   Patient has lost weight. A refill will be sent to the pharmacy.

## 2015-12-31 NOTE — Progress Notes (Signed)
Nurse notes reviewed, nothing to add.   Vitals with BMI 12/31/2015 12/03/2015 11/05/2015 11/05/2015 09/25/2015  Height    5\' 0"  5\' 0"   Weight 156 lbs 161 lbs  161 lbs 168 lbs  BMI    0000000 [id number]  Systolic 0000000 0000000 [id number]  Diastolic 94 92 85 91 84  Pulse 96 85  82 71    BMI 31.0-31.9,adult - previously 32+, improved on phentermine - Plan: phentermine 37.5 MG capsule  Abnormal weight gain - Plan: phentermine 37.5 MG capsule  Obesity  Phentermine initial Rx 3/29 at 161 lbs #2 Rx 12/31/2015 at 156 lbs

## 2016-01-30 ENCOUNTER — Ambulatory Visit (INDEPENDENT_AMBULATORY_CARE_PROVIDER_SITE_OTHER): Payer: BLUE CROSS/BLUE SHIELD | Admitting: Osteopathic Medicine

## 2016-01-30 VITALS — BP 119/80 | HR 68 | Wt 155.0 lb

## 2016-01-30 DIAGNOSIS — E669 Obesity, unspecified: Secondary | ICD-10-CM | POA: Diagnosis not present

## 2016-01-30 DIAGNOSIS — Z6831 Body mass index (BMI) 31.0-31.9, adult: Secondary | ICD-10-CM | POA: Diagnosis not present

## 2016-01-30 DIAGNOSIS — R635 Abnormal weight gain: Secondary | ICD-10-CM

## 2016-01-30 MED ORDER — PHENTERMINE HCL 37.5 MG PO CAPS: 37.5000 mg | ORAL_CAPSULE | ORAL | Status: DC

## 2016-01-30 NOTE — Progress Notes (Signed)
Patient is here for blood pressure and weight check. Denies any trouble sleeping, palpitations, or any other medication problems. Patient reports she has been out of town a lot this month and did not take her Phentermine Rx with her. Patient also reports she has not been taking it daily, but she is going to start back on the daily regime. Patient has lost one pound. A refill for Phentermine will be sent to patient preferred pharmacy. Encouraged Patient to strive for a minimum 4 pound weight loss monthly. Patient advised to schedule a four week nurse visit and keep her upcoming appointment with her PCP. Verbalized understanding, no further questions.

## 2016-01-30 NOTE — Progress Notes (Signed)
BP 119/80 mmHg  Pulse 68  Wt 155 lb (70.308 kg)  BMI 31.0-31.9,adult - previously 32+, improved on phentermine - Plan: phentermine 37.5 MG capsule  Abnormal weight gain - Plan: phentermine 37.5 MG capsule  Obesity   This is patient's third prescription of phentermine, minimal weight loss, which try for at least 1-2 more months and then consider alternative long-term therapy if patient desires.

## 2016-02-26 ENCOUNTER — Ambulatory Visit: Payer: BLUE CROSS/BLUE SHIELD

## 2016-02-27 ENCOUNTER — Ambulatory Visit (INDEPENDENT_AMBULATORY_CARE_PROVIDER_SITE_OTHER): Payer: BLUE CROSS/BLUE SHIELD | Admitting: Family Medicine

## 2016-02-27 VITALS — BP 129/85 | HR 98 | Resp 16 | Wt 155.0 lb

## 2016-02-27 DIAGNOSIS — R635 Abnormal weight gain: Secondary | ICD-10-CM | POA: Diagnosis not present

## 2016-02-27 DIAGNOSIS — Z6831 Body mass index (BMI) 31.0-31.9, adult: Secondary | ICD-10-CM | POA: Diagnosis not present

## 2016-02-27 MED ORDER — PHENTERMINE HCL 37.5 MG PO CAPS: 37.5000 mg | ORAL_CAPSULE | ORAL | Status: DC

## 2016-02-27 NOTE — Progress Notes (Signed)
   Subjective:    Patient ID: Diane Warren, female    DOB: 04-30-1971, 46 y.o.   MRN: [id number]  HPI Patient is here for blood pressure and weight check. She denies trouble sleeping, palpitations or medication problems. States she has been traveling and not getting walking exercise, although she has started doing toning exercises. She comments she is feeling great and wearing clothing from before her weight gain.   Review of Systems     Objective:   Physical Exam        Assessment & Plan:  Patient has not lost weight this past interval but is pleasing with process and wants to continue phentermine. A refill for phentermine will be faxed to her pharmacy. Advised her to schedule next appointment for 30 days; she is due to see her provider at that visit. pak

## 2016-02-27 NOTE — Progress Notes (Signed)
Seems reasonable to refill since not gaining weight but I would consider not refilling if another month goes by without loss.

## 2016-03-29 ENCOUNTER — Ambulatory Visit: Payer: BLUE CROSS/BLUE SHIELD | Admitting: Osteopathic Medicine

## 2016-04-01 ENCOUNTER — Ambulatory Visit (INDEPENDENT_AMBULATORY_CARE_PROVIDER_SITE_OTHER): Payer: BLUE CROSS/BLUE SHIELD | Admitting: Osteopathic Medicine

## 2016-04-01 ENCOUNTER — Encounter: Payer: Self-pay | Admitting: Osteopathic Medicine

## 2016-04-01 VITALS — BP 129/84 | HR 86 | Ht 60.0 in | Wt 157.0 lb

## 2016-04-01 DIAGNOSIS — Z0000 Encounter for general adult medical examination without abnormal findings: Secondary | ICD-10-CM

## 2016-04-01 DIAGNOSIS — E669 Obesity, unspecified: Secondary | ICD-10-CM

## 2016-04-01 NOTE — Progress Notes (Signed)
HPI: Diane Warren is a 45 y.o. Not Hispanic or Latino female  who presents to [facility] today, 04/01/16,  for chief complaint of:  Chief Complaint  Patient presents with  . Follow-up    weight    Obesity: Patient admits that she has not been very consistent about taking phentermine, she did not even pick up her most recent prescription. Significant stress due to recent cancer diagnosis of her mother, but she is hoping to get back on track after a vacation with her family next week. Has been intermittently good about calorie counting and exercise but admits room for improvement in this area as well.       Past medical, surgical, social and family history reviewed: No past medical history on file. Past Surgical History:  Procedure Laterality Date  . BUNIONECTOMY Right 2004  . CESAREAN SECTION    . TONSILLECTOMY     Social History  Substance Use Topics  . Smoking status: Never Smoker  . Smokeless tobacco: Not on file  . Alcohol use No   Family History  Problem Relation Age of Onset  . Hypertension Mother   . Hyperlipidemia Mother   . Cancer Maternal Uncle     LUNG  . Cancer Paternal Uncle     SKIN  . Cancer Maternal Grandmother     BREAST  . Cancer Paternal Grandmother     BREAST  . Diabetes Paternal Grandmother   . Cancer Mother     Uterine Cancer     Current medication list and allergy/intolerance information reviewed:   Current Outpatient Prescriptions  Medication Sig Dispense Refill  . Olopatadine HCl 0.6 % SOLN 2 sprays each nostril, twice per day 1 Bottle 6  . phentermine 37.5 MG capsule Take 1 capsule (37.5 mg total) by mouth every morning. 30 capsule 0   No current facility-administered medications for this visit.    No Known Allergies    Review of Systems:  Constitutional: No recent illness, No unintentional weight changes. (+)significant fatigue and stress  Cardiac: No  chest pain  Respiratory:  No  shortness  of breath  Exam:  BP 129/84   Pulse 86   Ht 5' (1.524 m)   Wt 157 lb (71.2 kg)   BMI 30.66 kg/m   Constitutional: VS see above. General Appearance: alert, well-developed, well-nourished, NAD  Psychiatric: Normal judgment/insight. Normal mood and affect. Oriented x3.    No results found for this or any previous visit (from the past 72 hour(s)).  No results found.   ASSESSMENT/PLAN:   Faythe Ghee to continue consistent use of phentermine for 1 more month, patient was given list of other long-term medication options for obesity treatment, encouraged to do her own research with regard to effectiveness/side effects, insurance coverage, etc. Will discuss further at her next visit which will do her annual physical exam, okay to get labs done prior to that visit, see below.   Reiterated importance of consistent lifestyle changes as far as diet and exercise are concerned, is encouraging that the patient has not gained weight back to her original 161 weight, but we haven't made much progress in terms of weight loss for her.  Obesity  Annual physical exam - Plan: CBC with Differential/Platelet, COMPLETE METABOLIC PANEL WITH GFR, Lipid panel, TSH, VITAMIN D 25 Hydroxy (Vit-D Deficiency, Fractures)     Visit summary with medication list and pertinent instructions was printed for patient to review. All questions at time of visit were answered -  patient instructed to contact office with any additional concerns. ER/RTC precautions were reviewed with the patient. Follow-up plan: Return in about 4 weeks (around 04/29/2016), or sooner if needed, for [facility].  Note: Total time spent 15 minutes, greater than 50% of the visit was spent face-to-face counseling and coordinating care for the following: The primary encounter diagnosis was Obesity.

## 2016-04-01 NOTE — Patient Instructions (Signed)
Recommend researching these medications and see which you'd be interested in and which may be covered by your insurance.   Qsymia (Phentermine and Topiramate)  Saxenda (Liraglutide 3 mg/day)  Contrave (Bupropion and Naltrexone)  Lorcaserin (Belviq or Belviq XR)  Orlistat (Xenical, Alli)  Bupropion (Wellbutrin)

## 2016-04-29 ENCOUNTER — Encounter: Payer: BLUE CROSS/BLUE SHIELD | Admitting: Osteopathic Medicine

## 2016-05-13 ENCOUNTER — Telehealth: Payer: BLUE CROSS/BLUE SHIELD | Admitting: Physician Assistant

## 2016-05-13 DIAGNOSIS — K529 Noninfective gastroenteritis and colitis, unspecified: Secondary | ICD-10-CM

## 2016-05-13 MED ORDER — ONDANSETRON HCL 4 MG PO TABS: 4.0000 mg | ORAL_TABLET | Freq: Three times a day (TID) | ORAL | 0 refills | Status: DC | PRN

## 2016-05-13 NOTE — Progress Notes (Signed)
We are sorry that you are not feeling well.  Here is how we plan to help!  Based on what you have shared with me it looks like you have Acute Infectious Diarrhea.  Most cases of acute diarrhea are due to infections with virus and bacteria and are self-limited conditions lasting less than 14 days.  For your symptoms you may take Imodium 2 mg tablets that are over the counter at your local pharmacy. Take two tablet now and then one after each loose stool up to 6 a day.  Antibiotics are not needed for most people with diarrhea.  I would recommend over-the-counter ginger supplement to help with nausea. I will send in a prescription for zofran to help with nausea as well. Your body will get a hold of this. If symptoms are not improving over the next few days of fever > 100 while on tylenol/ibuprofen, please let us know or go to see your primary care provider.  HOME CARE  We recommend changing your diet to help with your symptoms for the next few days.  Drink plenty of fluids that contain water salt and sugar. Sports drinks such as Gatorade may help.   You may try broths, soups, bananas, applesauce, soft breads, mashed potatoes or crackers.   You are considered infectious for as long as the diarrhea continues. Hand washing or use of alcohol based hand sanitizers is recommend.  It is best to stay out of work or school until your symptoms stop.   GET HELP RIGHT AWAY  If you have dark yellow colored urine or do not pass urine frequently you should drink more fluids.    If your symptoms worsen   If you feel like you are going to pass out (faint)  You have a new problem  MAKE SURE YOU   Understand these instructions.  Will watch your condition.  Will get help right away if you are not doing well or get worse.  Your e-visit answers were reviewed by a board certified advanced clinical practitioner to complete your personal care plan.  Depending on the condition, your plan could have  included both over the counter or prescription medications.  If there is a problem please reply  once you have received a response from your provider.  Your safety is important to [place].  If you have drug allergies check your prescription carefully.    You can use MyChart to ask questions about today's visit, request a non-urgent call back, or ask for a work or school excuse for 24 hours related to this e-Visit. If it has been greater than 24 hours you will need to follow up with your provider, or enter a new e-Visit to address those concerns.   You will get an e-mail in the next two days asking about your experience.  I hope that your e-visit has been valuable and will speed your recovery. Thank you for using e-visits.

## 2016-05-18 LAB — CBC WITH DIFFERENTIAL/PLATELET
Basophils Absolute: 0 cells/uL (ref 0–200)
Basophils Relative: 0 %
Eosinophils Absolute: 66 cells/uL (ref 15–500)
Eosinophils Relative: 1 %
HCT: 37.2 % (ref 35.0–45.0)
Hemoglobin: 12.6 g/dL (ref 11.7–15.5)
Lymphocytes Relative: 27 %
Lymphs Abs: 1782 cells/uL (ref 850–3900)
MCH: 29.6 pg (ref 27.0–33.0)
MCHC: 33.9 g/dL (ref 32.0–36.0)
MCV: 87.3 fL (ref 80.0–100.0)
MPV: 11.7 fL (ref 7.5–12.5)
Monocytes Absolute: 396 cells/uL (ref 200–950)
Monocytes Relative: 6 %
Neutro Abs: 4356 cells/uL (ref 1500–7800)
Neutrophils Relative %: 66 %
Platelets: 280 10*3/uL (ref 140–400)
RBC: 4.26 MIL/uL (ref 3.80–5.10)
RDW: 13.8 % (ref 11.0–15.0)
WBC: 6.6 10*3/uL (ref 3.8–10.8)

## 2016-05-19 LAB — COMPLETE METABOLIC PANEL WITH GFR
ALT: 10 U/L (ref 6–29)
AST: 16 U/L (ref 10–35)
Albumin: 4.4 g/dL (ref 3.6–5.1)
Alkaline Phosphatase: 59 U/L (ref 33–115)
BUN: 9 mg/dL (ref 7–25)
CO2: 25 mmol/L (ref 20–31)
Calcium: 9.4 mg/dL (ref 8.6–10.2)
Chloride: 104 mmol/L (ref 98–110)
Creat: 0.73 mg/dL (ref 0.50–1.10)
GFR, Est African American: >89 mL/min (ref >=60)
GFR, Est Non African American: >89 mL/min (ref >=60)
Glucose, Bld: 74 mg/dL (ref 65–99)
Potassium: 4.1 mmol/L (ref 3.5–5.3)
Sodium: 138 mmol/L (ref 135–146)
Total Bilirubin: 0.4 mg/dL (ref 0.2–1.2)
Total Protein: 7 g/dL (ref 6.1–8.1)

## 2016-05-19 LAB — LIPID PANEL
Cholesterol: 180 mg/dL (ref 125–200)
HDL: 63 mg/dL (ref >=46)
LDL Cholesterol: 103 mg/dL (ref <130)
Total CHOL/HDL Ratio: 2.9 Ratio (ref <=5.0)
Triglycerides: 72 mg/dL (ref <150)
VLDL: 14 mg/dL (ref <30)

## 2016-05-19 LAB — TSH: TSH: 1.48 mIU/L

## 2016-05-19 LAB — VITAMIN D 25 HYDROXY (VIT D DEFICIENCY, FRACTURES): Vit D, 25-Hydroxy: 27 ng/mL — ABNORMAL LOW (ref 30–100)

## 2016-05-21 ENCOUNTER — Ambulatory Visit (INDEPENDENT_AMBULATORY_CARE_PROVIDER_SITE_OTHER): Payer: BLUE CROSS/BLUE SHIELD | Admitting: Osteopathic Medicine

## 2016-05-21 ENCOUNTER — Encounter: Payer: Self-pay | Admitting: Osteopathic Medicine

## 2016-05-21 VITALS — BP 132/79 | HR 75 | Ht 60.0 in | Wt 158.0 lb

## 2016-05-21 DIAGNOSIS — Z0000 Encounter for general adult medical examination without abnormal findings: Secondary | ICD-10-CM | POA: Insufficient documentation

## 2016-05-21 DIAGNOSIS — Z23 Encounter for immunization: Secondary | ICD-10-CM

## 2016-05-21 NOTE — Progress Notes (Signed)
HPI: Diane Warren is a 45 y.o. female  who presents to [place] today, 05/21/16,  for chief complaint of:  Chief Complaint  Patient presents with  . Annual Exam    Preventive care reviewed as below.   Constipation and pressure, particularly prior to starting her cycle. Her dad has hx polyps, mom also had polyps. GYN referred to GI for appt 06/02/16.    Past medical, surgical, social and family history reviewed: No past medical history on file. Past Surgical History:  Procedure Laterality Date  . BUNIONECTOMY Right 2004  . CESAREAN SECTION    . TONSILLECTOMY    . TUBAL LIGATION     Social History  Substance Use Topics  . Smoking status: Never Smoker  . Smokeless tobacco: Never Used  . Alcohol use No   Family History  Problem Relation Age of Onset  . Hypertension Mother   . Hyperlipidemia Mother   . Cancer Mother     Uterine Cancer  . Cancer Maternal Uncle     LUNG  . Cancer Paternal Uncle     SKIN  . Cancer Maternal Grandmother     BREAST  . Cancer Paternal Grandmother     BREAST  . Diabetes Paternal Grandmother      Current medication list and allergy/intolerance information reviewed:   Current Outpatient Prescriptions  Medication Sig Dispense Refill  . MICROGESTIN FE 1/20 1-20 MG-MCG tablet     . Olopatadine HCl 0.6 % SOLN 2 sprays each nostril, twice per day 1 Bottle 6  . ondansetron (ZOFRAN) 4 MG tablet Take 1 tablet (4 mg total) by mouth every 8 (eight) hours as needed for nausea or vomiting. 20 tablet 0  . phentermine 37.5 MG capsule Take 1 capsule (37.5 mg total) by mouth every morning. 30 capsule 0   No current facility-administered medications for this visit.    No Known Allergies    Review of Systems:  Constitutional:  No  fever, no chills, No recent illness, No unintentional weight changes. No significant fatigue.   HEENT: No  headache, no vision change,  Cardiac: No  chest pain, No   pressure  Respiratory:  No  shortness of breath.   Gastrointestinal: + occasional lower abdominal pain, No  nausea, No  vomiting,  No  blood in stool, No  diarrhea, No  constipation   Musculoskeletal: No new myalgia/arthralgia  Genitourinary: No  incontinence, No  abnormal genital bleeding, No abnormal genital discharge  Skin: No  Rash, No other wounds/concerning lesions Psychiatric: No  concerns with depression, No  concerns with anxiety Exam:  BP 132/79   Pulse 75   Ht 5' (1.524 m)   Wt 158 lb (71.7 kg)   BMI 30.86 kg/m   Constitutional: VS see above. General Appearance: alert, well-developed, well-nourished, NAD  Eyes: Normal lids and conjunctive, non-icteric sclera  Ears, Nose, Mouth, Throat: MMM, Normal external inspection ears/nares/mouth/lips/gums. TM normal bilaterally. Pharynx/tonsils no erythema, no exudate. Nasal mucosa normal.   Neck: No masses, trachea midline. No thyroid enlargement. No tenderness/mass appreciated. No lymphadenopathy  Respiratory: Normal respiratory effort. no wheeze, no rhonchi, no rales  Cardiovascular: S1/S2 normal, no murmur, no rub/gallop auscultated. RRR. No lower extremity edema.   Gastrointestinal: Nontender, no masses. No hepatomegaly, no splenomegaly. No hernia appreciated. Bowel sounds normal. Rectal exam deferred.   Musculoskeletal: Gait normal. No clubbing/cyanosis of digits.   Neurological: No cranial nerve deficit on limited exam. Motor and sensation intact and symmetric. Cerebellar reflexes intact.  Normal balance/coordination. No tremor.   Skin: warm, dry, intact. No rash/ulcer. No concerning nevi or subq nodules on limited exam.    Psychiatric: Normal judgment/insight. Normal mood and affect. Oriented x3.    Results for orders placed or performed in visit on 04/01/16 (from the past 72 hour(s))  CBC with Differential/Platelet     Status: None   Collection Time: 05/18/16  9:09 AM  Result Value Ref Range   WBC 6.6 3.8 - 10.8  K/uL   RBC 4.26 3.80 - 5.10 MIL/uL   Hemoglobin 12.6 11.7 - 15.5 g/dL   HCT 37.2 35.0 - 45.0 %   MCV 87.3 80.0 - 100.0 fL   MCH 29.6 27.0 - 33.0 pg   MCHC 33.9 32.0 - 36.0 g/dL   RDW 13.8 11.0 - 15.0 %   Platelets 280 140 - 400 K/uL   MPV 11.7 7.5 - 12.5 fL   Neutro Abs 4,356 1,500 - 7,800 cells/uL   Lymphs Abs 1,782 850 - 3,900 cells/uL   Monocytes Absolute 396 200 - 950 cells/uL   Eosinophils Absolute 66 15 - 500 cells/uL   Basophils Absolute 0 0 - 200 cells/uL   Neutrophils Relative % 66 %   Lymphocytes Relative 27 %   Monocytes Relative 6 %   Eosinophils Relative 1 %   Basophils Relative 0 %   Smear Review Criteria for review not met   COMPLETE METABOLIC PANEL WITH GFR     Status: None   Collection Time: 05/18/16  9:09 AM  Result Value Ref Range   Sodium 138 135 - 146 mmol/L   Potassium 4.1 3.5 - 5.3 mmol/L   Chloride 104 98 - 110 mmol/L   CO2 25 20 - 31 mmol/L   Glucose, Bld 74 65 - 99 mg/dL   BUN 9 7 - 25 mg/dL   Creat 0.73 0.50 - 1.10 mg/dL   Total Bilirubin 0.4 0.2 - 1.2 mg/dL   Alkaline Phosphatase 59 33 - 115 U/L   AST 16 10 - 35 U/L   ALT 10 6 - 29 U/L   Total Protein 7.0 6.1 - 8.1 g/dL   Albumin 4.4 3.6 - 5.1 g/dL   Calcium 9.4 8.6 - 10.2 mg/dL   GFR, Est African American >89 >=60 mL/min   GFR, Est Non African American >89 >=60 mL/min  Lipid panel     Status: None   Collection Time: 05/18/16  9:09 AM  Result Value Ref Range   Cholesterol 180 125 - 200 mg/dL   Triglycerides 72 <150 mg/dL   HDL 63 >=46 mg/dL   Total CHOL/HDL Ratio 2.9 <=5.0 Ratio   VLDL 14 <30 mg/dL   LDL Cholesterol 103 <130 mg/dL    Comment:   Total Cholesterol/HDL Ratio:CHD Risk                        Coronary Heart Disease Risk Table                                        Men       Women          1/2 Average Risk              3.4        3.3              Average Risk  5.0        4.4           2X Average Risk              9.6        7.1           3X Average Risk              23.4       11.0 Use the calculated Patient Ratio above and the CHD Risk table  to determine the patient's CHD Risk.   TSH     Status: None   Collection Time: 05/18/16  9:09 AM  Result Value Ref Range   TSH 1.48 mIU/L    Comment:   Reference Range   > or = 20 Years  0.40-4.50   Pregnancy Range First trimester  0.26-2.66 Second trimester 0.55-2.73 Third trimester  0.43-2.91     VITAMIN D 25 Hydroxy (Vit-D Deficiency, Fractures)     Status: Abnormal   Collection Time: 05/18/16  9:09 AM  Result Value Ref Range   Vit D, 25-Hydroxy 27 (L) 30 - 100 ng/mL    Comment: Vitamin D Status           25-OH Vitamin D        Deficiency                <20 ng/mL        Insufficiency         20 - 29 ng/mL        Optimal             > or = 30 ng/mL   For 25-OH Vitamin D testing on patients on D2-supplementation and patients for whom quantitation of D2 and D3 fractions is required, the QuestAssureD 25-OH VIT D, (D2,D3), LC/MS/MS is recommended: order code [phone] (patients > 2 yrs).       ASSESSMENT/PLAN:   Encounter for annual physical exam  Need for prophylactic vaccination and inoculation against influenza - Plan: Flu Vaccine QUAD 36+ mos IM    FEMALE PREVENTIVE CARE Updated 05/21/16   ANNUAL SCREENING/COUNSELING Tobacco - noNever  Alcohol - none - special occasions Diet/Exercise - HEALTHY HABITS DISCUSSED TO DECREASE CV RISK Depression - PQH2 Negative Domestic violence concerns - no HTN SCREENING - SEE VITALS Vaccination status - SEE BELOW  SEXUAL HEALTH Sexually active in the past year - Yes with female. STI - The patient denies history of sexually transmitted disease. STI testing today? - no  INFECTIOUS DISEASE SCREENING HIV - all adults 15-65 - does not need GC/CT - sexually active - does not need HepC - DOB [birth year] - does not need TB - does not need  DISEASE SCREENING Lipid - does not need DM2 - does not need Osteoporosis - does not need  CANCER  SCREENING Cervical - does not need - we need results Breast - does not need - gets 3D mammograms  Lung - does not need Colon - does not need  ADULT VACCINATION Influenza - was given Td - was offered and declined by the patient - she will confirm last immunization  HPV - was not indicated Zoster - was not indicated Pneumonia - was not indicated  OTHER Fall - exercise and Vit D age 69+ - does not need Consider ASA - age 71-59 - does not need    Visit summary with medication list and pertinent instructions was printed for patient to review. All questions at  time of visit were answered - patient instructed to contact office with any additional concerns. ER/RTC precautions were reviewed with the patient. Follow-up plan: Return in about 1 year (around 05/21/2017), or sooner if needed, for ANNUAL PHYSICAL (Can call ahead for lab orders).

## 2016-07-22 ENCOUNTER — Ambulatory Visit (INDEPENDENT_AMBULATORY_CARE_PROVIDER_SITE_OTHER): Payer: BLUE CROSS/BLUE SHIELD | Admitting: Family Medicine

## 2016-07-22 ENCOUNTER — Encounter: Payer: Self-pay | Admitting: Family Medicine

## 2016-07-22 VITALS — BP 132/78 | HR 76 | Wt 160.0 lb

## 2016-07-22 DIAGNOSIS — Z23 Encounter for immunization: Secondary | ICD-10-CM

## 2016-07-22 DIAGNOSIS — M25551 Pain in right hip: Secondary | ICD-10-CM | POA: Diagnosis not present

## 2016-07-22 DIAGNOSIS — S76019A Strain of muscle, fascia and tendon of unspecified hip, initial encounter: Secondary | ICD-10-CM | POA: Insufficient documentation

## 2016-07-22 DIAGNOSIS — M25561 Pain in right knee: Secondary | ICD-10-CM | POA: Diagnosis not present

## 2016-07-22 DIAGNOSIS — M7040 Prepatellar bursitis, unspecified knee: Secondary | ICD-10-CM | POA: Insufficient documentation

## 2016-07-22 DIAGNOSIS — G8929 Other chronic pain: Secondary | ICD-10-CM

## 2016-07-22 DIAGNOSIS — M7041 Prepatellar bursitis, right knee: Secondary | ICD-10-CM

## 2016-07-22 MED ORDER — DICLOFENAC SODIUM 1 % TD GEL: 4.0000 g | Freq: Four times a day (QID) | TRANSDERMAL | 11 refills | Status: DC

## 2016-07-22 NOTE — Patient Instructions (Addendum)
Thank you for coming in today. Call or go to the ER if you develop a large red swollen joint with extreme pain or oozing puss.  Attend PT.  Use voltaren gel.  Return in 4 weeks or sooner if needed.    Prepatellar Bursitis Rehab Ask your health care provider which exercises are safe for you. Do exercises exactly as told by your health care provider and adjust them as directed. It is normal to feel mild stretching, pulling, tightness, or discomfort as you do these exercises, but you should stop right away if you feel sudden pain or your pain gets worse.Do not begin these exercises until told by your health care provider. Stretching and range of motion exercises These exercises warm up your muscles and joints and improve the movement and flexibility of your knee. These exercises also help to relieve pain, numbness, and tingling. Exercise A: Hamstring, standing 1. Stand with your __________ foot resting on a chair. Your __________ leg should be fully extended. 2. Arch your lower back slightly. 3. Leading with your chest, lean forward at the waist until you feel a gentle stretch in the back of your __________ knee or in your thigh. You should not need to lean far to feel a stretch. 4. Hold this position for __________ seconds. Repeat __________ times. Complete this stretch __________ times a day. Exercise B: Knee flexion, active heel slides 1. Lie on your back with both knees straight. If this causes back discomfort, bend your __________ knee so your foot is flat on the floor. 2. Slowly slide your __________ heel back toward your buttocks until you feel a gentle stretch in the front of your knee or thigh. 3. Hold this position for __________ seconds. 4. Slowly slide your __________ heel back to the starting position. Repeat __________ times. Complete this stretch __________ times a day. Strengthening exercises These exercises build strength and endurance in your knee. Endurance is the ability to  use your muscles for a long time, even after they get tired. Exercise C: Quadriceps, isometric 1. Lie on your back with your __________ leg extended and your __________ knee bent. 2. Slowly tense the muscles in the front of your __________ thigh. When you do this, you should see your kneecap slide up toward your hip or see increased dimpling just above the knee. This motion will push the back of your knee down toward the surface that is under it. 3. For __________ seconds, keep the muscle as tight as you can without increasing your pain. 4. Relax the muscles slowly and completely. Repeat __________ times. Complete this exercise __________ times a day. Exercise D: Straight leg raises (quadriceps) 1. Lie on your back with your __________ leg extended and your __________ knee bent. 2. Slowly tense the muscles in your __________ thigh. When you do this, you should see your kneecap slide up toward your hip or see increased dimpling just above the knee. 3. Keep these muscles tight as you raise your leg 4-6 inches (10-15 cm) off the floor. 4. Hold this position for __________ seconds. 5. Keep these muscles tense as you lower your leg slowly. 6. Relax your muscles slowly and completely. Repeat __________ times. Complete this exercise __________ times a day. Exercise E: Straight leg raises (hip extensors) 1. Lie on your abdomen on a bed or a firm surface. You can put a pillow under your hips if that is more comfortable. 2. Tense your buttock muscles and lift your __________ thigh. Your __________ knee can be  bent or straight, but do not let your back arch. 3. Hold this position for __________ seconds. 4. Slowly lower your leg to the starting position. 5. Let your muscles relax completely. Repeat __________ times. Complete this exercise __________ times a day. This information is not intended to replace advice given to you by your health care provider. Make sure you discuss any questions you have with  your health care provider. Document Released: 08/23/2005 Document Revised: 04/27/2016 Document Reviewed: 05/23/2015 Elsevier Interactive Patient Education  2017 [employer].

## 2016-07-22 NOTE — Progress Notes (Signed)
Subjective:    I'm seeing this patient as a consultation for:  Emeterio Reeve, DO  CC: right knee pain  HPI: Patient is a 45 y.o. Female who presents with R knee pain radiating to her inner thigh and groin area since early this year. She saw Dr. Sheppard Coil for this and had normal X-rays of the hip and knee. Tried PT for 1 month, which helped, but then she stopped due to family stressors with her mom starting chemotherapy. Pain has persisted since then. She feels pain in her medial knee and painful catching in her right hip whenever her knee turns a certain way, when she lifts her leg getting out of a car, and when she bends down to tie her shoes. She has tried compression with a knee brace, tylenol, and a heating pad, which have helped some.   Past medical history, Surgical history, Family history not pertinant except as noted below, Social history, Allergies, and medications have been entered into the medical record, reviewed, and no changes needed.   Review of Systems: No headache, visual changes, nausea, vomiting, diarrhea, constipation, dizziness, abdominal pain, skin rash, fevers, chills, night sweats, weight loss, swollen lymph nodes, chest pain, shortness of breath, mood changes, visual or auditory hallucinations.   Objective:    Vitals:   07/22/16 1515  BP: 132/78  Pulse: 76   General: Well Developed, well nourished, and in no acute distress.  Neuro/Psych: Alert and oriented x3, extra-ocular muscles intact, able to move all 4 extremities, sensation grossly intact. Skin: Warm and dry, no rashes noted.  Respiratory: Not using accessory muscles, speaking in full sentences, trachea midline.  Cardiovascular: Pulses palpable, no extremity edema.  Abdomen: Does not appear distended. MSK:   Right knee: Mild edema around prepatellar area. ROM 0-120 Tenderness to palpation over patellar tendon, as well as directly medial and lateral to tendon. Palpable squeak over  patella. Negative McMurray's test. No ligamentous instability.  Right hip before injection: Significant guarding throughout exam. Tenderness to palpation around groin tendon insertion sites. Pain in groin on hip flexion and external rotation, internal and external rotation without flexion, and adduction. Full ROM  Right hip after injection: Significant guarding throughout exam. Tenderness to palpation around hip adductor tendon insertion sites. Pain in groin on hip flexion and external rotation and adduction. Full ROM  Procedure: Real-time Ultrasound Guided Injection of right femoral acetabular joint  Device: GE Logiq E  Images permanently stored and available for review in the ultrasound unit. Verbal informed consent obtained. Discussed risks and benefits of procedure. Warned about infection bleeding damage to structures skin hypopigmentation and fat atrophy among others. Patient expresses understanding and agreement Time-out conducted.  Noted no overlying erythema, induration, or other signs of local infection.  Skin prepped in a sterile fashion.  Local anesthesia: Topical Ethyl chloride.  With sterile technique and under real time ultrasound guidance: 80 mg of Kenalog and 4 mL of Marcaine injected easily.  Completed without difficulty   Advised to call if fevers/chills, erythema, induration, drainage, or persistent bleeding.  Images permanently stored and available for review in the ultrasound unit.  Impression: Technically successful ultrasound guided injection.    No results found for this or any previous visit (from the past 24 hour(s)). No results found.  Impression and Recommendations:    Assessment and Plan: 45 y.o. female with chronic right knee and hip pain, likely multifactorial etiology.  Right hip pain: Most likely due to groin muscle dysfunction given pain at tendon insertion sites  and guarding on exam. This may be a consequence of chronic  compensation after bunion surgery or possible hip arthritis (given improvement in internal and external rotation with steroid injection). Expect improvement with physical therapy.  Right knee pain: Likely prepatellar bursitis. Treat with diclofenac gel. If no improvement, consider MRI.  Follow up in 4 weeks.  Discussed warning signs or symptoms. Please see discharge instructions. Patient expresses understanding.

## 2016-07-26 ENCOUNTER — Encounter: Payer: BLUE CROSS/BLUE SHIELD | Admitting: Family Medicine

## 2016-07-28 ENCOUNTER — Ambulatory Visit: Payer: BLUE CROSS/BLUE SHIELD | Admitting: Physical Therapy

## 2016-09-10 LAB — HM MAMMOGRAPHY

## 2016-09-22 ENCOUNTER — Encounter: Payer: Self-pay | Admitting: Osteopathic Medicine

## 2016-12-14 ENCOUNTER — Ambulatory Visit (INDEPENDENT_AMBULATORY_CARE_PROVIDER_SITE_OTHER): Payer: BLUE CROSS/BLUE SHIELD | Admitting: Osteopathic Medicine

## 2016-12-14 VITALS — BP 135/75 | HR 75 | Temp 98.3°F | Wt 165.0 lb

## 2016-12-14 DIAGNOSIS — B9689 Other specified bacterial agents as the cause of diseases classified elsewhere: Secondary | ICD-10-CM | POA: Diagnosis not present

## 2016-12-14 DIAGNOSIS — J329 Chronic sinusitis, unspecified: Secondary | ICD-10-CM

## 2016-12-14 MED ORDER — DOXYCYCLINE MONOHYDRATE 50 MG PO CAPS: 100.0000 mg | ORAL_CAPSULE | Freq: Two times a day (BID) | ORAL | 0 refills | Status: DC

## 2016-12-14 MED ORDER — IPRATROPIUM BROMIDE 0.03 % NA SOLN: 2.0000 | Freq: Four times a day (QID) | NASAL | 0 refills | Status: DC

## 2016-12-14 MED ORDER — DOXYCYCLINE MONOHYDRATE 100 MG PO CAPS: 100.0000 mg | ORAL_CAPSULE | Freq: Two times a day (BID) | ORAL | 0 refills | Status: DC

## 2016-12-14 NOTE — Progress Notes (Signed)
HPI: Diane Warren is a 46 y.o. female who presents to [facility] 12/14/16 for chief complaint of:  Chief Complaint  Patient presents with  . Facial Pain    Acute Illness: . Location & Quality: sinus pressure and headache . Assoc signs/symptoms: see ROS . Duration: 7+ days . Modifying factors: has tried the following OTC/Rx medications:  Amoxicillin and Tamiflu from [facility] (husband was positive for flu)    Past medical, social and family history reviewed.  Immune compromising conditions or other risk factors: none  Current medications and allergies reviewed.     Review of Systems:  Constitutional: No  fever/chills  HEENT: Yes  headache, No  sore throat, No  swollen glands  Cardiovascular: No chest pain  Respiratory:mild cough, No  shortness of breath    Detailed Exam:  BP 135/75 (BP Location: Left Arm, Patient Position: Sitting, Cuff Size: Normal)   Pulse 75   Temp 98.3 F (36.8 C) (Oral)   Wt 165 lb (74.8 kg)   SpO2 100%   BMI 32.22 kg/m   Constitutional:   VSS, see above.   General Appearance: alert, well-developed, well-nourished, NAD  Head, Eyes:   Normal lids and conjunctive, non-icteric sclera  (+)tenderness over maxillary sinuses bilateral   Ears, Nose, Mouth, Throat:   Normal external inspection ears/nares  Normal mouth/lips/gums, MMM  posterior pharynx without erythema, without exudate  nasal mucosa normal  Skin:  Normal inspection, no rash or concerning lesions noted on limited exam  Respiratory:   Normal respiratory effort.   No  wheeze/rhonchi/rales  Cardiovascular:   S1/S2 normal, no murmur/rub/gallop auscultated. RRR.    ASSESSMENT/PLAN:  Bacterial sinusitis - Plan: ipratropium (ATROVENT) 0.03 % nasal spray, doxycycline (MONODOX) 50 MG capsule     Patient Instructions  Plan: 1. Stop amoxicillin and start doxycycline antibiotic 2. Can continue Claritin, would add  Sudafed, have also sent an alternative nasal spray called Atrovent to use up to 4 times per day 3. If no better by the end of the week, or if anything worse is a changes, let us know. The next step would be a CT of the sinuses to further evaluate why a sinus infection might be resistant to medications 4. For headache, can also use over-the-counter acetaminophen plus or minus ibuprofen.     Visit summary was printed for the patient with medications and pertinent instructions for patient to review. ER/RTC precautions reviewed. All questions answered. Return if symptoms worsen or fail to improve.

## 2016-12-14 NOTE — Patient Instructions (Signed)
Plan: 1. Stop amoxicillin and start doxycycline antibiotic 2. Can continue Claritin, would add Sudafed, have also sent an alternative nasal spray called Atrovent to use up to 4 times per day 3. If no better by the end of the week, or if anything worse is a changes, let us know. The next step would be a CT of the sinuses to further evaluate why a sinus infection might be resistant to medications 4. For headache, can also use over-the-counter acetaminophen plus or minus ibuprofen.

## 2016-12-14 NOTE — Addendum Note (Signed)
Addended by: Maryla Morrow on: 12/14/2016 09:30 AM   Modules accepted: Orders

## 2016-12-14 NOTE — Progress Notes (Signed)
Pt has had sinus pain x 1 week.  [facility], started on tamiflu (husband dx with flu) and amoxicillin last week.  Still having pain under eyes.

## 2017-02-13 IMAGING — CR DG KNEE COMPLETE 4+V*R*
4 series · 4 of 4 positions shown · non-contrast
Comparison: None.

CLINICAL DATA: Right hip and knee pain for 4 months. No reported
acute injury.

EXAM:
RIGHT KNEE - COMPLETE 4+ VIEW

[knee ap]
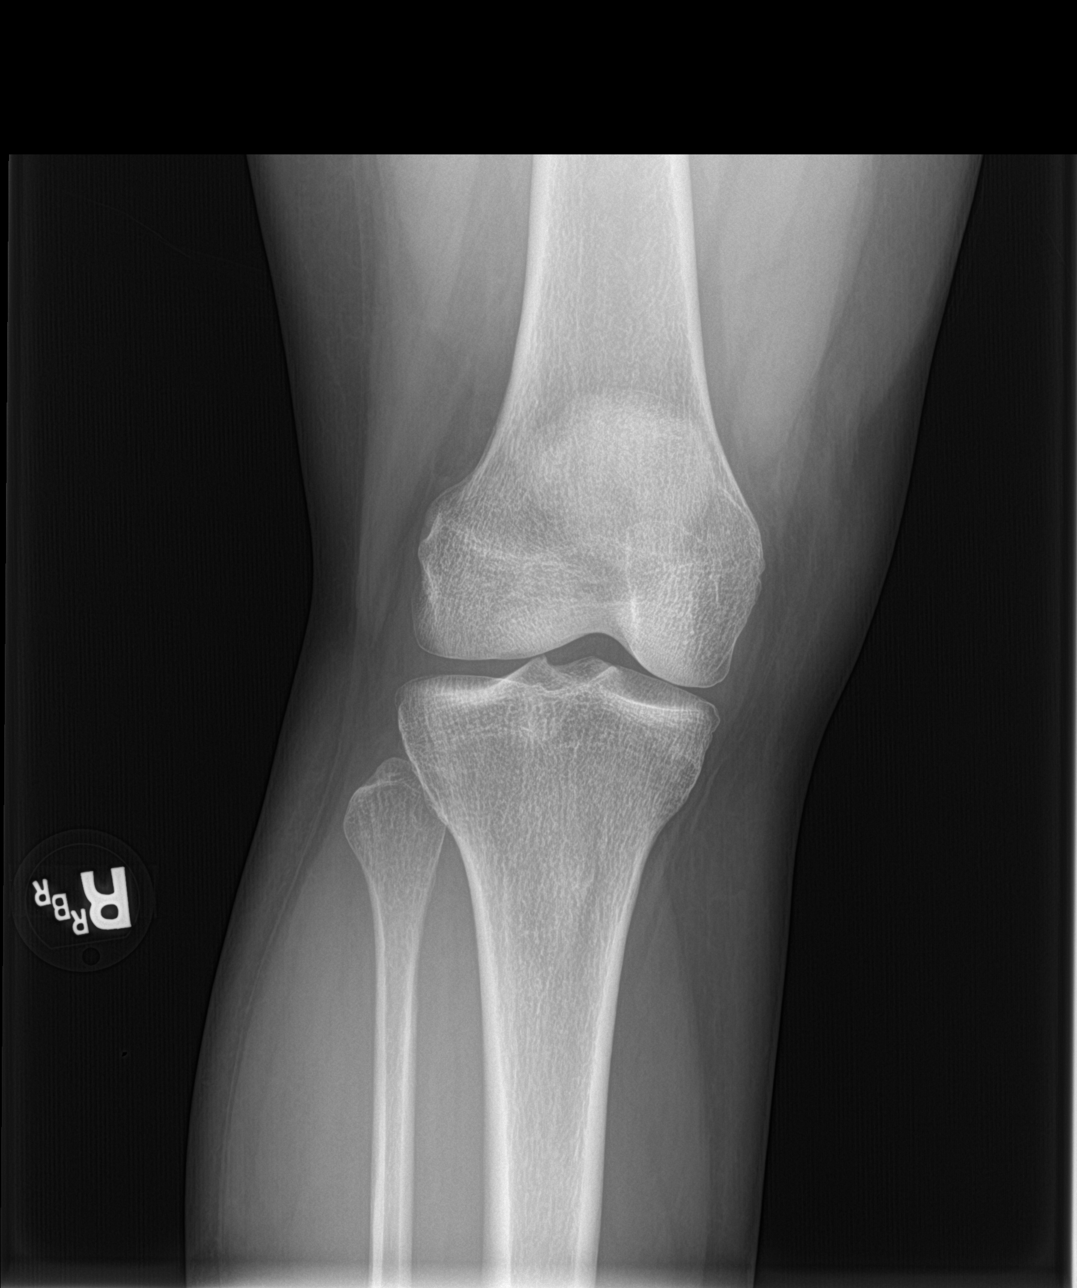

[tunnel]
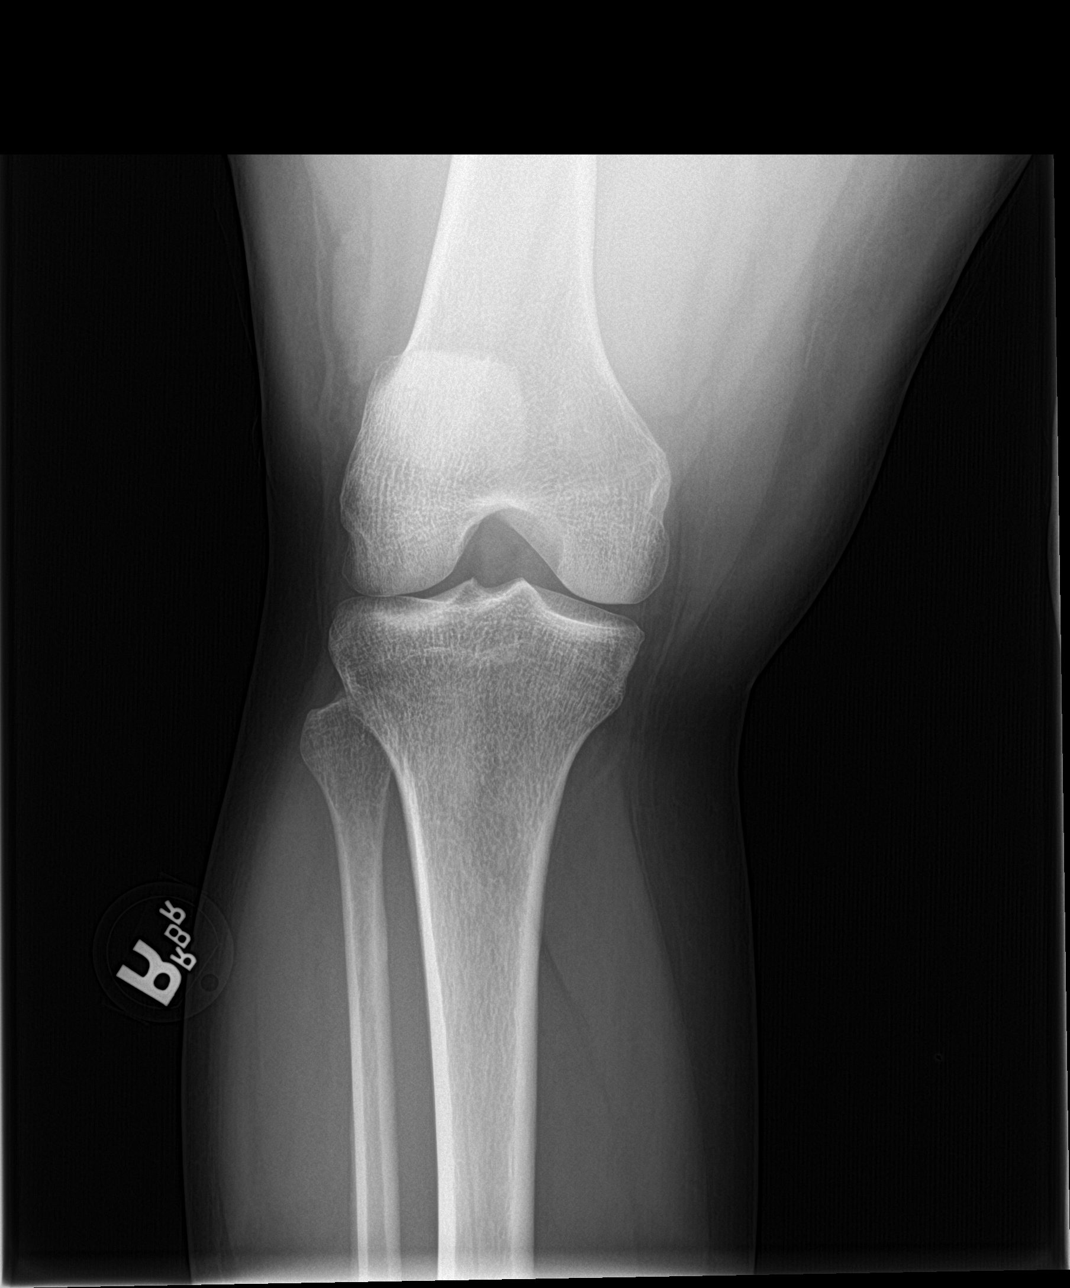

[knee lat]
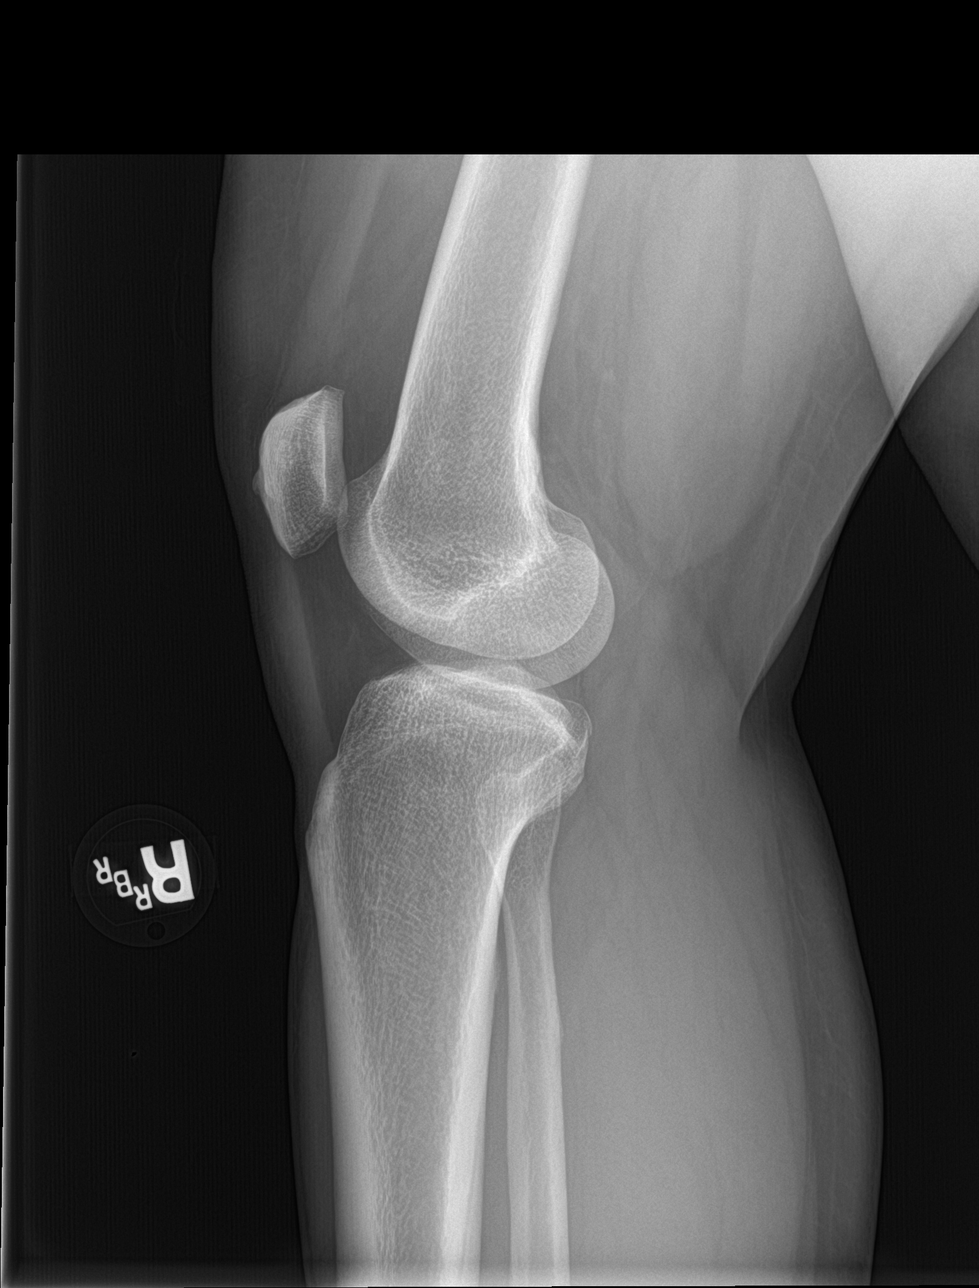

[knee sunrise]
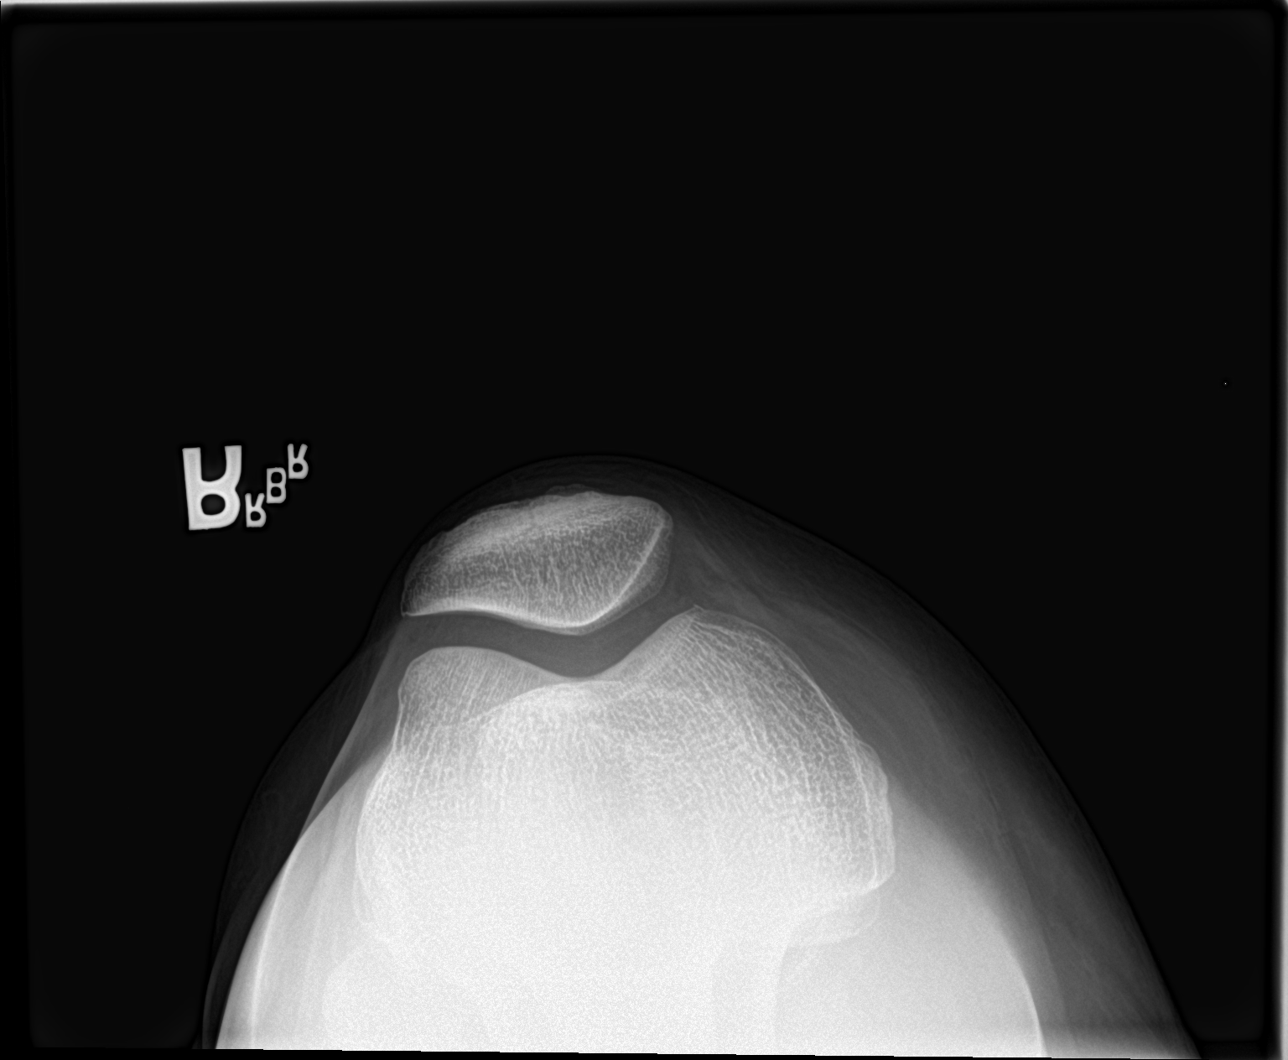

[4 of 4 positions shown; findings below may reference images not displayed]

FINDINGS: The mineralization and alignment are normal. There is no evidence of
acute fracture or dislocation. The joint spaces appear maintained.
No significant joint effusion or focal soft tissue abnormalities.
IMPRESSION: Negative right knee radiographs.

## 2017-06-29 ENCOUNTER — Ambulatory Visit (INDEPENDENT_AMBULATORY_CARE_PROVIDER_SITE_OTHER): Payer: BLUE CROSS/BLUE SHIELD | Admitting: Osteopathic Medicine

## 2017-06-29 ENCOUNTER — Encounter: Payer: Self-pay | Admitting: Osteopathic Medicine

## 2017-06-29 VITALS — BP 156/106 | HR 72 | Ht 60.0 in | Wt 171.0 lb

## 2017-06-29 DIAGNOSIS — Z0000 Encounter for general adult medical examination without abnormal findings: Secondary | ICD-10-CM

## 2017-06-29 DIAGNOSIS — R030 Elevated blood-pressure reading, without diagnosis of hypertension: Secondary | ICD-10-CM | POA: Diagnosis not present

## 2017-06-29 DIAGNOSIS — Z23 Encounter for immunization: Secondary | ICD-10-CM | POA: Diagnosis not present

## 2017-06-29 NOTE — Progress Notes (Signed)
HPI: Diane Warren is a 46 y.o. female  who presents to [facility] today, 06/29/17,  for chief complaint of:  No chief complaint on file.   Preventive care reviewed as below.   OBGYN put her on different OCP - s/p BTL but on these pills for periods.    Past medical, surgical, social and family history reviewed: No past medical history on file. Past Surgical History:  Procedure Laterality Date  . BUNIONECTOMY Right 2004  . CESAREAN SECTION    . TONSILLECTOMY    . TUBAL LIGATION     Social History  Substance Use Topics  . Smoking status: Never Smoker  . Smokeless tobacco: Never Used  . Alcohol use No   Family History  Problem Relation Age of Onset  . Hypertension Mother   . Hyperlipidemia Mother   . Cancer Mother        Uterine Cancer  . Cancer Maternal Uncle        LUNG  . Cancer Paternal Uncle        SKIN  . Cancer Maternal Grandmother        BREAST  . Cancer Paternal Grandmother        BREAST  . Diabetes Paternal Grandmother      Current medication list and allergy/intolerance information reviewed:   Current Outpatient Prescriptions  Medication Sig Dispense Refill  . diclofenac sodium (VOLTAREN) 1 % GEL Apply 4 g topically 4 (four) times daily. To affected joint. 100 g 11  . doxycycline (MONODOX) 100 MG capsule Take 1 capsule (100 mg total) by mouth 2 (two) times daily. For 7 days 14 capsule 0  . ipratropium (ATROVENT) 0.03 % nasal spray Place 2 sprays into both nostrils 4 (four) times daily. 30 mL 0  . MICROGESTIN FE 1/20 1-20 MG-MCG tablet     . Olopatadine HCl 0.6 % SOLN 2 sprays each nostril, twice per day 1 Bottle 6   No current facility-administered medications for this visit.    No Known Allergies    Review of Systems:  Constitutional:  No  fever, no chills, No recent illness, No unintentional weight changes. No significant fatigue.   HEENT: No  headache, no vision change,  Cardiac: No  chest pain, No   pressure  Respiratory:  No  shortness of breath.   Gastrointestinal: + occasional lower abdominal pain, No  nausea, No  vomiting,  No  blood in stool, No  diarrhea, No  constipation   Musculoskeletal: No new myalgia/arthralgia  Genitourinary: No  incontinence, No  abnormal genital bleeding, No abnormal genital discharge  Skin: No  Rash, No other wounds/concerning lesions  Psychiatric: No  concerns with depression, No  concerns with anxiety  Exam:  BP (!) 153/100   Pulse 72   Ht 5' (1.524 m)   Wt 171 lb (77.6 kg)   BMI 33.40 kg/m   Constitutional: VS see above. General Appearance: alert, well-developed, well-nourished, NAD  Eyes: Normal lids and conjunctive, non-icteric sclera  Ears, Nose, Mouth, Throat: MMM, Normal external inspection ears/nares/mouth/lips/gums. TM normal bilaterally. Pharynx/tonsils no erythema, no exudate. Nasal mucosa normal.   Neck: No masses, trachea midline. No thyroid enlargement. No tenderness/mass appreciated. No lymphadenopathy  Respiratory: Normal respiratory effort. no wheeze, no rhonchi, no rales  Cardiovascular: S1/S2 normal, no murmur, no rub/gallop auscultated. RRR. No lower extremity edema.   Gastrointestinal: Nontender, no masses. No hepatomegaly, no splenomegaly. No hernia appreciated. Bowel sounds normal. Rectal exam deferred.   Musculoskeletal: Gait normal.  No clubbing/cyanosis of digits.   Neurological: No cranial nerve deficit on limited exam. Motor and sensation intact and symmetric. Cerebellar reflexes intact. Normal balance/coordination. No tremor.   Skin: warm, dry, intact. No rash/ulcer. No concerning nevi or subq nodules on limited exam.    Psychiatric: Normal judgment/insight. Normal mood and affect. Oriented x3.      ASSESSMENT/PLAN:   Annual physical exam - Plan: CBC, COMPLETE METABOLIC PANEL WITH GFR, Lipid panel, TSH, VITAMIN D 25 Hydroxy (Vit-D Deficiency, Fractures)  Need for influenza vaccination - Plan: Flu  Vaccine QUAD 36+ mos IM  Elevated BP without diagnosis of hypertension - New problem, will follow-up with nurse visit in 2 weeks. If at goal or close, monitor. If still this high would start medication    FEMALE PREVENTIVE CARE Updated 06/29/17   ANNUAL SCREENING/COUNSELING Tobacco - noNever  Alcohol - none - special occasions Diet/Exercise - HEALTHY HABITS DISCUSSED TO DECREASE CV RISK Depression - PQH2 Negative Domestic violence concerns - no HTN SCREENING - SEE VITALS Vaccination status - SEE BELOW  SEXUAL HEALTH Sexually active in the past year - Yes with female. STI - The patient denies history of sexually transmitted disease. STI testing today? - no  INFECTIOUS DISEASE SCREENING HIV - all adults 15-65 - does not need GC/CT - sexually active - does not need HepC - DOB [birth year] - does not need TB - does not need  DISEASE SCREENING Lipid - does not need DM2 - does not need Osteoporosis - does not need  CANCER SCREENING Cervical - does not need - we need results Breast - does not need now - gets 3D mammograms  Lung - does not need Colon - does not need  ADULT VACCINATION Influenza - was given Td - was offered and declined by the patient - she will confirm last immunization  HPV - was not indicated Zoster - was not indicated Pneumonia - was not indicated  OTHER Fall - exercise and Vit D age 41+ - does not need Consider ASA - age 34-59 - does not need    Visit summary with medication list and pertinent instructions was printed for patient to review. All questions at time of visit were answered - patient instructed to contact office with any additional concerns. ER/RTC precautions were reviewed with the patient. Follow-up plan: Return in about 1 year (around 06/29/2018) for Bruno, but follow up in 2 weeks to recheck blood pressure.

## 2017-06-29 NOTE — Patient Instructions (Addendum)
Blood Pressure: Goal top number 130, bottom number 80. If still high at nurse visit, may need to start a blood pressure medicine :/   Weight loss: important things to remember  It is hard work! You will have setbacks, but don't get discouraged. The goal is not short-term pounds lost, it is long-term health.   Looking at the numbers is important to track your progress and set goals, but how you are feeling and your overall health are the most important things! BMI and pounds and calories and miles logged aren't everything - they are tools to help [place] reach your goals.  You can do this!!!   Things to remember for exercise for weight loss:   Please note - I am not a certified personal trainer. I can present you with ideas and general workout goals, but an exercise program is largely up to you. Find something you can stick with, and something you enjoy!   As you progress in your exercise regimen think about gradually increasing the following, week by week:   intensity (how strenuous is your workout)  frequency (how often you are exercising)  duration (how many minutes at a time you are exercising)  Walking for 20 minutes a day is certainly better than nothing, but more strenuous exercise will develop better cardiovascular fitness.   interval training (high-intensity alternating with low-intensity, think walk/jog rather than just walk)  Weight training and muscle strengthening exercises (weight lifting, calisthenics, yoga) - this also helps prevent osteoporosis!   Things to remember for diet changes for weight loss:   Please note - I am not a certified dietician. I can present you with ideas and general diet goals, but a meal plan is largely up to you. I am happy to refer you to a dietician who can give you a detailed meal plan.  Apps/logs are crucial to track how you're eating! It's not realistic to be logging everything you eat forever, but when you're starting a healthy eating lifestyle  it's very helpful, and checking in with logs now and then helps you stick to your program!   Calorie restriction with the goal weight loss of no more than one to two pounds per week.    Increase lean protein such as chicken, fish, [place].   Decrease fatty foods such as dairy, butter.   Decrease sugary foods. Avoid sugary drinks such as soda or juice.  Increase fiber found in fruit and vegetables, nuts, whole grains

## 2017-06-30 LAB — CBC
HCT: 35.8 % (ref 35.0–45.0)
Hemoglobin: 12.2 g/dL (ref 11.7–15.5)
MCH: 29.8 pg (ref 27.0–33.0)
MCHC: 34.1 g/dL (ref 32.0–36.0)
MCV: 87.5 fL (ref 80.0–100.0)
MPV: 10.9 fL (ref 7.5–12.5)
Platelets: 232 10*3/uL (ref 140–400)
RBC: 4.09 10*6/uL (ref 3.80–5.10)
RDW: 13 % (ref 11.0–15.0)
WBC: 6.1 10*3/uL (ref 3.8–10.8)

## 2017-06-30 LAB — LIPID PANEL
Cholesterol: 200 mg/dL — ABNORMAL HIGH (ref <200)
HDL: 63 mg/dL (ref >50)
LDL Cholesterol (Calc): 122 mg/dL (calc) — ABNORMAL HIGH
Non-HDL Cholesterol (Calc): 137 mg/dL (calc) — ABNORMAL HIGH (ref <130)
Total CHOL/HDL Ratio: 3.2 (calc) (ref <5.0)
Triglycerides: 62 mg/dL (ref <150)

## 2017-06-30 LAB — VITAMIN D 25 HYDROXY (VIT D DEFICIENCY, FRACTURES): Vit D, 25-Hydroxy: 32 ng/mL (ref 30–100)

## 2017-06-30 LAB — COMPLETE METABOLIC PANEL WITH GFR
AG Ratio: 2 (calc) (ref 1.0–2.5)
ALT: 11 U/L (ref 6–29)
AST: 13 U/L (ref 10–35)
Albumin: 4.5 g/dL (ref 3.6–5.1)
Alkaline phosphatase (APISO): 60 U/L (ref 33–115)
BUN: 9 mg/dL (ref 7–25)
CO2: 28 mmol/L (ref 20–32)
Calcium: 9.1 mg/dL (ref 8.6–10.2)
Chloride: 104 mmol/L (ref 98–110)
Creat: 0.78 mg/dL (ref 0.50–1.10)
GFR, Est African American: 105 mL/min/{1.73_m2} (ref >=60)
GFR, Est Non African American: 91 mL/min/{1.73_m2} (ref >=60)
Globulin: 2.3 g/dL (calc) (ref 1.9–3.7)
Glucose, Bld: 92 mg/dL (ref 65–99)
Potassium: 4.3 mmol/L (ref 3.5–5.3)
Sodium: 138 mmol/L (ref 135–146)
Total Bilirubin: 0.4 mg/dL (ref 0.2–1.2)
Total Protein: 6.8 g/dL (ref 6.1–8.1)

## 2017-06-30 LAB — TSH: TSH: 1.15 mIU/L

## 2017-07-13 ENCOUNTER — Ambulatory Visit (INDEPENDENT_AMBULATORY_CARE_PROVIDER_SITE_OTHER): Payer: BLUE CROSS/BLUE SHIELD | Admitting: Osteopathic Medicine

## 2017-07-13 ENCOUNTER — Encounter: Payer: Self-pay | Admitting: Osteopathic Medicine

## 2017-07-13 VITALS — BP 139/83 | HR 82 | Temp 98.5°F | Resp 18 | Wt 168.0 lb

## 2017-07-13 DIAGNOSIS — R030 Elevated blood-pressure reading, without diagnosis of hypertension: Secondary | ICD-10-CM

## 2017-07-13 DIAGNOSIS — H6981 Other specified disorders of Eustachian tube, right ear: Secondary | ICD-10-CM | POA: Diagnosis not present

## 2017-07-13 DIAGNOSIS — T63301A Toxic effect of unspecified spider venom, accidental (unintentional), initial encounter: Secondary | ICD-10-CM

## 2017-07-13 MED ORDER — AMOXICILLIN-POT CLAVULANATE 875-125 MG PO TABS: 1.0000 | ORAL_TABLET | Freq: Two times a day (BID) | ORAL | 0 refills | Status: DC

## 2017-07-13 NOTE — Patient Instructions (Signed)
Over-the-Counter Medications & Home Remedies for Upper Respiratory Illness  Note: the following list assumes no pregnancy, normal liver & kidney function and no other drug interactions. Dr. Javae Braaten has highlighted medications which are safe for you to use, but these may not be appropriate for everyone. Always ask a pharmacist or qualified medical provider if you have any questions!   Aches/Pains, Fever, Headache Acetaminophen (Tylenol) 500 mg tablets - take max 2 tablets (1000 mg) every 6 hours (4 times per day)  Ibuprofen (Motrin) 200 mg tablets - take max 4 tablets (800 mg) every 6 hours*  Sinus Congestion Prescription Atrovent as directed Nasal Saline if desired Oxymetolazone (Afrin, others) sparing use due to rebound congestion, NEVER use in kids Phenylephrine (Sudafed) 10 mg tablets every 4 hours (or the 12-hour formulation)* Diphenhydramine (Benadryl) 25 mg tablets - take max 2 tablets every 4 hours  Cough & Sore Throat Prescription cough pills or syrups as directed Dextromethorphan (Robitussin, others) - cough suppressant Guaifenesin (Robitussin, Mucinex, others) - expectorant (helps cough up mucus) (Dextromethorphan and Guaifenesin also come in a combination tablet) Lozenges w/ Benzocaine + Menthol (Cepacol) Honey - as much as you want! Teas which "coat the throat" - look for ingredients Elm Bark, Licorice Root, Marshmallow Root  Other Antibiotics if these are prescribed - take ALL, even if you're feeling better  Zinc Lozenges within 24 hours of symptoms onset - mixed evidence this shortens the duration of the common cold Don't waste your money on Vitamin C or Echinacea  *Caution in patients with high blood pressure   

## 2017-07-13 NOTE — Progress Notes (Signed)
HPI: Diane Warren is a 46 y.o. female  who presents to [facility] today, 07/13/17,  for chief complaint of:  Chief Complaint  Patient presents with  . Hypertension    follow up    BP: see graph below. Creeping up a bit. Last visit was well above goal but (+)stress that day so we opted to follow up for recheck.   Home BP cuff 159/87 just now and our BP 139/83. Home BP measurements consistently 630'Z-601'U systolic, 93'A diastolic.      Ear pressure on R: ongoing since 5 days ago, some scratchy throat, chronic allergies/sinus - takes Flonase daily and ads Claritin as needed. No fever/chills, no headache.   Bug bits on chest: few days ago, itching, controlled with topical benadryl and seems to be healing ok.      Past medical history, surgical history, social history and family history reviewed.  Patient Active Problem List   Diagnosis Date Noted  . Strain of hip adductor muscle, initial encounter 07/22/2016  . Prepatellar bursitis 07/22/2016  . Encounter for annual physical exam 05/21/2016  . Obesity 12/31/2015  . Right knee pain 09/25/2015  . Right hip pain 09/25/2015  . BMI 32.0-32.9,adult 09/25/2015    Current medication list and allergy/intolerance information reviewed.    Current Outpatient Medications on File Prior to Visit  Medication Sig Dispense Refill  . diclofenac sodium (VOLTAREN) 1 % GEL Apply 4 g topically 4 (four) times daily. To affected joint. 100 g 11  . Olopatadine HCl 0.6 % SOLN 2 sprays each nostril, twice per day 1 Bottle 6   No current facility-administered medications on file prior to visit.     No Known Allergies    Review of Systems:  Constitutional: No recent illness  HEENT: No  headache, no vision change, +ear pressure as per HPI   Cardiac: No  chest pain, No  pressure, No palpitations  Respiratory:  No  shortness of breath. No  Cough  Gastrointestinal: No  abdominal pain  Musculoskeletal: No  new myalgia/arthralgia  Skin: +Rash w/ spider bite  Hem/Onc: No  easy bruising/bleeding  Neurologic: No  weakness, No  Dizziness   Exam:  BP 139/83 (BP Location: Left Arm, Patient Position: Sitting, Cuff Size: Large)   Pulse 82   Wt 168 lb (76.2 kg)   SpO2 99%   BMI 32.81 kg/m   Constitutional: VS see above. General Appearance: alert, well-developed, well-nourished, NAD  Eyes: Normal lids and conjunctive, non-icteric sclera  Ears, Nose, Mouth, Throat: MMM, Normal external inspection ears/nares/mouth/lips/gums. TM clear effusion bilaterally actually a bit worse on the L. Normal nasal mucosa and oropharynx  Neck: No masses, trachea midline.   Respiratory: Normal respiratory effort. no wheeze, no rhonchi, no rales  Cardiovascular: S1/S2 normal, no murmur, no rub/gallop auscultated. RRR.   Musculoskeletal: Gait normal. Symmetric and independent movement of all extremities  Neurological: Normal balance/coordination. No tremor.  Skin: warm, dry, intact. Mild urticarial rash on chest, c/w insect bite.   Psychiatric: Normal judgment/insight. Normal mood and affect. Oriented x3.        ASSESSMENT/PLAN:   Elevated BP without diagnosis of hypertension - home BP cuff a bit higher than ours (07/13/17), home BP readings ok. BP in office better than last visit. Monitor q 6 mos  Dysfunction of right eustachian tube - trial afrin x1-2 days, abx written for sinusitis if no better   Allergic reaction to spider bite, accidental or unintentional, initial encounter - healing ok, continus  self care, RTC as needed     Patient Instructions  Over-the-Counter Medications & Home Remedies for Upper Respiratory Illness  Note: the following list assumes no pregnancy, normal liver & kidney function and no other drug interactions. Dr. Sheppard Coil has highlighted medications which are safe for you to use, but these may not be appropriate for everyone. Always ask a pharmacist or qualified medical  provider if you have any questions!   Aches/Pains, Fever, Headache Acetaminophen (Tylenol) 500 mg tablets - take max 2 tablets (1000 mg) every 6 hours (4 times per day)  Ibuprofen (Motrin) 200 mg tablets - take max 4 tablets (800 mg) every 6 hours*  Sinus Congestion Prescription Atrovent as directed Nasal Saline if desired Oxymetolazone (Afrin, others) sparing use due to rebound congestion, NEVER use in kids Phenylephrine (Sudafed) 10 mg tablets every 4 hours (or the 12-hour formulation)* Diphenhydramine (Benadryl) 25 mg tablets - take max 2 tablets every 4 hours  Cough & Sore Throat Prescription cough pills or syrups as directed Dextromethorphan (Robitussin, others) - cough suppressant Guaifenesin (Robitussin, Mucinex, others) - expectorant (helps cough up mucus) (Dextromethorphan and Guaifenesin also come in a combination tablet) Lozenges w/ Benzocaine + Menthol (Cepacol) Honey - as much as you want! Teas which "coat the throat" - look for ingredients Elm Bark, Licorice Root, Marshmallow Root  Other Antibiotics if these are prescribed - take ALL, even if you're feeling better  Zinc Lozenges within 24 hours of symptoms onset - mixed evidence this shortens the duration of the common cold Don't waste your money on Vitamin C or Echinacea  *Caution in patients with high blood pressure       Follow-up plan: Return in about 6 months (around 01/10/2018) for recheck blood pressure .  Visit summary with medication list and pertinent instructions was printed for patient to review, alert [place] if any changes needed. All questions at time of visit were answered - patient instructed to contact office with any additional concerns. ER/RTC precautions were reviewed with the patient and understanding verbalized.   Note: Total time spent 25 minutes, greater than 50% of the visit was spent face-to-face counseling and coordinating care for the following: The primary encounter diagnosis was Elevated BP  without diagnosis of hypertension. Diagnoses of Dysfunction of right eustachian tube and Allergic reaction to spider bite, accidental or unintentional, initial encounter were also pertinent to this visit.Marland Kitchen  Please note: voice recognition software was used to produce this document, and typos may escape review. Please contact me for any needed clarifications.

## 2017-09-21 LAB — HM MAMMOGRAPHY

## 2017-09-23 ENCOUNTER — Encounter: Payer: Self-pay | Admitting: Osteopathic Medicine

## 2017-12-13 ENCOUNTER — Encounter: Payer: Self-pay | Admitting: Osteopathic Medicine

## 2017-12-13 ENCOUNTER — Ambulatory Visit (INDEPENDENT_AMBULATORY_CARE_PROVIDER_SITE_OTHER): Payer: BLUE CROSS/BLUE SHIELD | Admitting: Osteopathic Medicine

## 2017-12-13 ENCOUNTER — Telehealth: Payer: Self-pay | Admitting: Osteopathic Medicine

## 2017-12-13 VITALS — BP 140/85 | HR 80 | Temp 98.3°F | Wt 168.0 lb

## 2017-12-13 DIAGNOSIS — N951 Menopausal and female climacteric states: Secondary | ICD-10-CM

## 2017-12-13 DIAGNOSIS — G43011 Migraine without aura, intractable, with status migrainosus: Secondary | ICD-10-CM

## 2017-12-13 DIAGNOSIS — G43909 Migraine, unspecified, not intractable, without status migrainosus: Secondary | ICD-10-CM

## 2017-12-13 DIAGNOSIS — R030 Elevated blood-pressure reading, without diagnosis of hypertension: Secondary | ICD-10-CM | POA: Diagnosis not present

## 2017-12-13 DIAGNOSIS — G43009 Migraine without aura, not intractable, without status migrainosus: Secondary | ICD-10-CM | POA: Diagnosis not present

## 2017-12-13 HISTORY — DX: Migraine, unspecified, not intractable, without status migrainosus: G43.909

## 2017-12-13 HISTORY — DX: Menopausal and female climacteric states: N95.1

## 2017-12-13 MED ORDER — PROMETHAZINE HCL 25 MG PO TABS: 12.5000 mg | ORAL_TABLET | ORAL | 1 refills | Status: DC | PRN

## 2017-12-13 MED ORDER — KETOROLAC TROMETHAMINE 60 MG/2ML IM SOLN
60.0000 mg | Freq: Once | INTRAMUSCULAR | Status: AC
Start: 2017-12-13 — End: 2017-12-13
  Administered 2017-12-13: 60 mg via INTRAMUSCULAR

## 2017-12-13 MED ORDER — FROVATRIPTAN SUCCINATE 2.5 MG PO TABS: 2.5000 mg | ORAL_TABLET | ORAL | 0 refills | Status: DC | PRN

## 2017-12-13 NOTE — Progress Notes (Signed)
. HPI: Diane Warren is a 47 y.o. female who  has a past medical history of Migraine headache (12/13/2017) and Perimenopausal (12/13/2017).  she presents to [facility] today, 12/13/17,  for chief complaint of: Migraine  Migraine headaches about every month. OB/GYN started her on Imitrex which typically helps but notices headache comes back or persists to a milder degree the next day. Almost always happens on the third week of her birth control pills, which she is taking for perimenopausal symptoms and are really helpful for her. Headache described as throbbing, unilateral typically on the right side, associated with photophobia, phonophobia, nausea.  Headache was severe yesterday, Imitrex has helped but headache has still persisted into today. She is not taking any medications today, Imitrex works really well but tends to make her quite sleepy.    Past medical, surgical, social and family history reviewed:  Patient Active Problem List   Diagnosis Date Noted  . Elevated BP without diagnosis of hypertension 07/13/2017  . Strain of hip adductor muscle, initial encounter 07/22/2016  . Prepatellar bursitis 07/22/2016  . Encounter for annual physical exam 05/21/2016  . Obesity 12/31/2015  . Right knee pain 09/25/2015  . Right hip pain 09/25/2015  . BMI 32.0-32.9,adult 09/25/2015    Past Surgical History:  Procedure Laterality Date  . BUNIONECTOMY Right 2004  . CESAREAN SECTION    . TONSILLECTOMY    . TUBAL LIGATION      Social History   Tobacco Use  . Smoking status: Never Smoker  . Smokeless tobacco: Never Used  Substance Use Topics  . Alcohol use: No    Family History  Problem Relation Age of Onset  . Hypertension Mother   . Hyperlipidemia Mother   . Cancer Mother        Uterine Cancer  . Cancer Maternal Uncle        LUNG  . Cancer Paternal Uncle        SKIN  . Cancer Maternal Grandmother        BREAST  . Cancer Paternal  Grandmother        BREAST  . Diabetes Paternal Grandmother      Current medication list and allergy/intolerance information reviewed:    Current Outpatient Medications  Medication Sig Dispense Refill  . Drospirenone-Ethinyl Estradiol-Levomefol (BEYAZ) 3-0.02-0.451 MG tablet   4  . SUMAtriptan (IMITREX) 25 MG tablet Take 25 mg by mouth daily as needed.  3  . amoxicillin-clavulanate (AUGMENTIN) 875-125 MG tablet Take 1 tablet 2 (two) times daily by mouth. For 7 days. Expires 08/05/17 (Patient not taking: Reported on 12/13/2017) 14 tablet 0  . diclofenac sodium (VOLTAREN) 1 % GEL Apply 4 g topically 4 (four) times daily. To affected joint. (Patient not taking: Reported on 12/13/2017) 100 g 11  . frovatriptan (FROVA) 2.5 MG tablet Take 1 tablet (2.5 mg total) by mouth as needed for migraine. If recurs, may repeat after 2 hours. Max of 3 tabs in 24 hours. 10 tablet 0  . Olopatadine HCl 0.6 % SOLN 2 sprays each nostril, twice per day (Patient not taking: Reported on 12/13/2017) 1 Bottle 6  . promethazine (PHENERGAN) 25 MG tablet Take 0.5-1 tablets (12.5-25 mg total) by mouth every 4 (four) hours as needed for nausea or vomiting. 30 tablet 1   No current facility-administered medications for this visit.     No Known Allergies    Review of Systems:  Constitutional:  No  fever, no chills, No recent illness, No  unintentional weight changes. No significant fatigue.   HEENT: +headache, no vision change, no hearing change, No sore throat, +sinus pressure  Cardiac: No  chest pain, No  pressure,  Respiratory:  No  shortness of breath. No  Cough  Gastrointestinal: No  abdominal pain, +nausea, No  vomiting,   Musculoskeletal: No new myalgia/arthralgia  Skin: No  Rash, No other wounds/concerning lesions  Neurologic: No  weakness, No  dizziness, No  slurred speech/focal weakness/facial droop  Exam:  BP 140/85   Pulse 80   Temp 98.3 F (36.8 C) (Oral)   Wt 168 lb 0.6 oz (76.2 kg)   BMI 32.82  kg/m   Constitutional: VS see above. General Appearance: alert, well-developed, well-nourished, NAD  Eyes: Normal lids and conjunctive, non-icteric sclera  Ears, Nose, Mouth, Throat: MMM, Normal external inspection ears/nares/mouth/lips/gums.   Neck: No masses, trachea midline. No thyroid enlargement. No tenderness/mass appreciated. No lymphadenopathy  Respiratory: Normal respiratory effort. no wheeze, no rhonchi, no rales  Cardiovascular: S1/S2 normal, no murmur, no rub/gallop auscultated. RRR  Musculoskeletal: Gait normal.   Neurological: Normal balance/coordination. No tremor. No cranial nerve deficit on limited exam. Motor and sensation intact and symmetric. Cerebellar reflexes intact.   Skin: warm, dry, intact. No rash/ulcer.   Psychiatric: Normal judgment/insight. Normal mood and affect. Oriented x3.      ASSESSMENT/PLAN:   Intractable migraine without aura and with status migrainosus ideally would like off OCP as these may be exacerbating migraines, cautious use of triptan's given borderline blood pressure. I think short acting Imitrex followed by longer acting Frova may work better and help prevent recurrence, can add Phenergan for nausea and patient also advised to take NSAIDs with initial onset of headache as well. We'll see how she does on this regimen and may consider preventive medications or injections, she is not interested in starting daily prophylaxis or injections at this point but will consider these as needed  Meds ordered this encounter  Medications  . frovatriptan (FROVA) 2.5 MG tablet    Sig: Take 1 tablet (2.5 mg total) by mouth as needed for migraine. If recurs, may repeat after 2 hours. Max of 3 tabs in 24 hours.    Dispense:  10 tablet    Refill:  0  . promethazine (PHENERGAN) 25 MG tablet    Sig: Take 0.5-1 tablets (12.5-25 mg total) by mouth every 4 (four) hours as needed for nausea or vomiting.    Dispense:  30 tablet    Refill:  1        Patient Instructions   Can stay on Imitrex and take for quick relief of migraine  2 hours after Imitrex, can take Frova which lasts a bit longer and may hepl prevent headache from coming back    Phenergan for nausea as needed  Can also add w/ initial headache treatment Ibuprofen 800 mg every 8 hours can take with Imitrex    We have options for prevention medications, we can discuss further!      Visit summary with medication list and pertinent instructions was printed for patient to review. All questions at time of visit were answered - patient instructed to contact office with any additional concerns. ER/RTC precautions were reviewed with the patient.   Follow-up plan: Return in about 6 weeks (around 01/24/2018) for recheck migraine, sooner if needed  .    Please note: voice recognition software was used to produce this document, and typos may escape review. Please contact Dr. Sheppard Coil for any needed clarifications.

## 2017-12-13 NOTE — Patient Instructions (Addendum)
   Can stay on Imitrex and take for quick relief of migraine  2 hours after Imitrex, can take Frova which lasts a bit longer and may hepl prevent headache from coming back    Phenergan for nausea as needed  Can also add w/ initial headache treatment Ibuprofen 800 mg every 8 hours can take with Imitrex    We have options for prevention medications, we can discuss further!

## 2017-12-13 NOTE — Telephone Encounter (Signed)
Received fax from [employer] that Frova requires a PA. Information has been sent to the insurance company. Awaiting determination.

## 2017-12-16 NOTE — Telephone Encounter (Signed)
Received fax from [facility] that [place] was approved from 12/14/2017 through 12/12/2020. Pharmacy notified and forms sent to scan.   Reference ID: [id number].

## 2017-12-20 ENCOUNTER — Telehealth: Payer: Self-pay

## 2017-12-20 NOTE — Telephone Encounter (Signed)
As per [place] - pt's copay for Frova medication is $423.35. Requesting to change to another medication that is similar & less costly for pt. Pls advise, thanks.

## 2017-12-21 MED ORDER — FROVATRIPTAN SUCCINATE 2.5 MG PO TABS: 2.5000 mg | ORAL_TABLET | ORAL | 0 refills | Status: DC | PRN

## 2017-12-21 NOTE — Telephone Encounter (Signed)
I routed this to Barnet Pall to see if we can get a PA for this or alternative, please let the patient know we are working on it and contact us if she hasn't heard anything back about this issue within one week. Thanks!

## 2017-12-21 NOTE — Telephone Encounter (Signed)
Done! Thanks, Barnet Pall!

## 2017-12-21 NOTE — Telephone Encounter (Signed)
Can you send a new rx for the medication that the patient is wanting and I will work on the [place]. Thanks

## 2018-01-11 ENCOUNTER — Ambulatory Visit: Payer: BLUE CROSS/BLUE SHIELD | Admitting: Osteopathic Medicine

## 2018-01-23 ENCOUNTER — Ambulatory Visit: Payer: BLUE CROSS/BLUE SHIELD | Admitting: Osteopathic Medicine

## 2018-01-23 ENCOUNTER — Encounter: Payer: Self-pay | Admitting: Osteopathic Medicine

## 2018-01-23 VITALS — BP 145/95 | HR 78 | Temp 98.3°F | Wt 168.0 lb

## 2018-01-23 DIAGNOSIS — R030 Elevated blood-pressure reading, without diagnosis of hypertension: Secondary | ICD-10-CM | POA: Diagnosis not present

## 2018-01-23 DIAGNOSIS — G43009 Migraine without aura, not intractable, without status migrainosus: Secondary | ICD-10-CM

## 2018-01-23 MED ORDER — SUMATRIPTAN SUCCINATE 50 MG PO TABS: 25.0000 mg | ORAL_TABLET | Freq: Every day | ORAL | 5 refills | Status: DC | PRN

## 2018-01-23 NOTE — Patient Instructions (Signed)
Plan:  Can increase Imitrex to 50 mg as needed, can still take 1/2 dose (25 mg) as well if desired.   Can try skipping the birth control for awhile and see if this makes a difference

## 2018-01-23 NOTE — Progress Notes (Signed)
HPI: Diane Warren is a 47 y.o. female who  has a past medical history of Migraine headache (12/13/2017) and Perimenopausal (12/13/2017).  she presents to [facility] today, 01/23/18,  for chief complaint of:  Recheck Migraine  Last visit 12/13/17 - discussed Imitrex plus NSAID at migraine onset, try longer-acting Frova if headache persists and hopefully this prevents recurrence. NO need/desire for ppx at that time but we did discuss it as an option if necessary.   Today, states wasn't able to get the Frova due to cost. Taking the Imitrex still. Getting headaches around 9th/10th and the 20th of the month, before and after menstruation. Imitrex typically still helps.     Past medical history, surgical history, and family history reviewed.  Current medication list and allergy/intolerance information reviewed.   (See remainder of HPI, ROS, Phys Exam below)    ASSESSMENT/PLAN:   Migraine without aura and without status migrainosus, not intractable  Borderline blood pressure - headache now, BP usually at goal at home, continue to monitor   Meds ordered this encounter  Medications  . SUMAtriptan (IMITREX) 50 MG tablet    Sig: Take 0.5-1 tablets (25-50 mg total) by mouth daily as needed for migraine (Can repeat dose in 2 hours if headaches persist).    Dispense:  20 tablet    Refill:  5    Patient Instructions  Plan:  Can increase Imitrex to 50 mg as needed, can still take 1/2 dose (25 mg) as well if desired.   Can try skipping the birth control for awhile and see if this makes a difference    Follow-up plan: Return for recheck migraine as needed, otherwise see me when due for annual! .     ############################################ ############################################ ############################################ ############################################    Outpatient Encounter Medications as of 01/23/2018  Medication Sig  .  Drospirenone-Ethinyl Estradiol-Levomefol (BEYAZ) 3-0.02-0.451 MG tablet   . frovatriptan (FROVA) 2.5 MG tablet Take 1 tablet (2.5 mg total) by mouth as needed for migraine. If recurs, may repeat after 2 hours. Max of 3 tabs in 24 hours.  . promethazine (PHENERGAN) 25 MG tablet Take 0.5-1 tablets (12.5-25 mg total) by mouth every 4 (four) hours as needed for nausea or vomiting.  . SUMAtriptan (IMITREX) 25 MG tablet Take 25 mg by mouth daily as needed.   No facility-administered encounter medications on file as of 01/23/2018.    No Known Allergies    Review of Systems:  Constitutional: No recent illness  HEENT: +headache, no vision change  Cardiac: No  chest pain, No  pressure, No palpitations  Respiratory:  No  shortness of breath. No  Cough  Gastrointestinal: No  abdominal pain  Musculoskeletal: No new myalgia/arthralgia  Neurologic: No  weakness, No  Dizziness  Psychiatric: No  concerns with depression, No  concerns with anxiety  Exam:  BP (!) 145/95   Pulse 78   Temp 98.3 F (36.8 C) (Oral)   Wt 168 lb (76.2 kg)   BMI 32.81 kg/m   Constitutional: VS see above. General Appearance: alert, well-developed, well-nourished, NAD  Eyes: Normal lids and conjunctive, non-icteric sclera  Ears, Nose, Mouth, Throat: MMM, Normal external inspection ears/nares/mouth/lips/gums.  Neck: No masses, trachea midline.   Respiratory: Normal respiratory effort. no wheeze, no rhonchi, no rales  Cardiovascular: S1/S2 normal, no murmur, no rub/gallop auscultated. RRR.   Musculoskeletal: Gait normal. Symmetric and independent movement of all extremities  Neurological: Normal balance/coordination. No tremor.  Skin: warm, dry, intact.   Psychiatric:  Normal judgment/insight. Normal mood and affect. Oriented x3.   Visit summary with medication list and pertinent instructions was printed for patient to review, advised to alert [place] if any changes needed. All questions at time of visit were  answered - patient instructed to contact office with any additional concerns. ER/RTC precautions were reviewed with the patient and understanding verbalized.   Follow-up plan: Return for recheck migraine as needed, otherwise see me when due for annual! .  Note: Total time spent 25 minutes, greater than 50% of the visit was spent face-to-face counseling and coordinating care for the following: The primary encounter diagnosis was Migraine without aura and without status migrainosus, not intractable. A diagnosis of Borderline blood pressure was also pertinent to this visit.Marland Kitchen  Please note: voice recognition software was used to produce this document, and typos may escape review. Please contact Dr. Sheppard Coil for any needed clarifications.

## 2018-03-18 ENCOUNTER — Telehealth: Payer: BLUE CROSS/BLUE SHIELD | Admitting: Family

## 2018-03-18 ENCOUNTER — Encounter: Payer: Self-pay | Admitting: Osteopathic Medicine

## 2018-03-18 DIAGNOSIS — N390 Urinary tract infection, site not specified: Secondary | ICD-10-CM

## 2018-03-18 MED ORDER — CEPHALEXIN 500 MG PO CAPS: 500.0000 mg | ORAL_CAPSULE | Freq: Two times a day (BID) | ORAL | 0 refills | Status: DC

## 2018-03-18 NOTE — Progress Notes (Signed)
Thank you for the details you included in the comment boxes. Those details are very helpful in determining the best course of treatment for you and help [place] to provide the best care. It's OK about the Augmentin although there is a better treatment below.  We are sorry that you are not feeling well.  Here is how we plan to help!  Based on what you shared with me it looks like you most likely have a simple urinary tract infection.  A UTI (Urinary Tract Infection) is a bacterial infection of the bladder.  Most cases of urinary tract infections are simple to treat but a key part of your care is to encourage you to drink plenty of fluids and watch your symptoms carefully.  I have prescribed Keflex 500 mg twice a day for 7 days.  Your symptoms should gradually improve. Call us if the burning in your urine worsens, you develop worsening fever, back pain or pelvic pain or if your symptoms do not resolve after completing the antibiotic.  Urinary tract infections can be prevented by drinking plenty of water to keep your body hydrated.  Also be sure when you wipe, wipe from front to back and don't hold it in!  If possible, empty your bladder every 4 hours.  Your e-visit answers were reviewed by a board certified advanced clinical practitioner to complete your personal care plan.  Depending on the condition, your plan could have included both over the counter or prescription medications.  If there is a problem please reply  once you have received a response from your provider.  Your safety is important to [place].  If you have drug allergies check your prescription carefully.    You can use MyChart to ask questions about today's visit, request a non-urgent call back, or ask for a work or school excuse for 24 hours related to this e-Visit. If it has been greater than 24 hours you will need to follow up with your provider, or enter a new e-Visit to address those concerns.   You will get an e-mail in the next two  days asking about your experience.  I hope that your e-visit has been valuable and will speed your recovery. Thank you for using e-visits.

## 2018-03-27 ENCOUNTER — Ambulatory Visit (INDEPENDENT_AMBULATORY_CARE_PROVIDER_SITE_OTHER): Payer: BLUE CROSS/BLUE SHIELD | Admitting: Physician Assistant

## 2018-03-27 ENCOUNTER — Encounter: Payer: Self-pay | Admitting: Physician Assistant

## 2018-03-27 VITALS — BP 141/86 | HR 79 | Temp 98.1°F | Resp 14 | Wt 169.0 lb

## 2018-03-27 DIAGNOSIS — R3129 Other microscopic hematuria: Secondary | ICD-10-CM | POA: Diagnosis not present

## 2018-03-27 DIAGNOSIS — R1031 Right lower quadrant pain: Secondary | ICD-10-CM

## 2018-03-27 DIAGNOSIS — R82998 Other abnormal findings in urine: Secondary | ICD-10-CM

## 2018-03-27 DIAGNOSIS — R030 Elevated blood-pressure reading, without diagnosis of hypertension: Secondary | ICD-10-CM

## 2018-03-27 LAB — COMPREHENSIVE METABOLIC PANEL
AG Ratio: 1.8 (calc) (ref 1.0–2.5)
ALT: 9 U/L (ref 6–29)
AST: 13 U/L (ref 10–35)
Albumin: 4.6 g/dL (ref 3.6–5.1)
Alkaline phosphatase (APISO): 73 U/L (ref 33–115)
BUN: 8 mg/dL (ref 7–25)
CO2: 29 mmol/L (ref 20–32)
Calcium: 9.6 mg/dL (ref 8.6–10.2)
Chloride: 104 mmol/L (ref 98–110)
Creat: 0.84 mg/dL (ref 0.50–1.10)
Globulin: 2.5 g/dL (calc) (ref 1.9–3.7)
Glucose, Bld: 87 mg/dL (ref 65–99)
Potassium: 4.1 mmol/L (ref 3.5–5.3)
Sodium: 137 mmol/L (ref 135–146)
Total Bilirubin: 0.4 mg/dL (ref 0.2–1.2)
Total Protein: 7.1 g/dL (ref 6.1–8.1)

## 2018-03-27 LAB — POCT URINALYSIS DIPSTICK
Bilirubin, UA: NEGATIVE
Glucose, UA: NEGATIVE
Ketones, UA: NEGATIVE
Nitrite, UA: NEGATIVE
Protein, UA: NEGATIVE
Spec Grav, UA: <=1.005 — AB (ref 1.010–1.025)
Urobilinogen, UA: 0.2 E.U./dL
pH, UA: 6 (ref 5.0–8.0)

## 2018-03-27 LAB — CBC WITH DIFFERENTIAL/PLATELET
Basophils Absolute: 28 cells/uL (ref 0–200)
Basophils Relative: 0.4 %
Eosinophils Absolute: 131 cells/uL (ref 15–500)
Eosinophils Relative: 1.9 %
HCT: 37.4 % (ref 35.0–45.0)
Hemoglobin: 12.6 g/dL (ref 11.7–15.5)
Lymphs Abs: 1622 cells/uL (ref 850–3900)
MCH: 28.9 pg (ref 27.0–33.0)
MCHC: 33.7 g/dL (ref 32.0–36.0)
MCV: 85.8 fL (ref 80.0–100.0)
MPV: 11.3 fL (ref 7.5–12.5)
Monocytes Relative: 5.8 %
Neutro Abs: 4720 cells/uL (ref 1500–7800)
Neutrophils Relative %: 68.4 %
Platelets: 234 10*3/uL (ref 140–400)
RBC: 4.36 10*6/uL (ref 3.80–5.10)
RDW: 13.5 % (ref 11.0–15.0)
Total Lymphocyte: 23.5 %
WBC mixed population: 400 cells/uL (ref 200–950)
WBC: 6.9 10*3/uL (ref 3.8–10.8)

## 2018-03-27 LAB — LIPASE: Lipase: 15 U/L (ref 7–60)

## 2018-03-27 LAB — POCT URINE PREGNANCY: Preg Test, Ur: NEGATIVE

## 2018-03-27 NOTE — Patient Instructions (Signed)

## 2018-03-27 NOTE — Progress Notes (Signed)
HPI:                                                                Diane Warren is a 47 y.o. female who presents to [facility] today for RLQ abdominal pain  Last Saturday began having lower abdominal pressure that never went away, described as "nagging pain." Associated with constipation and urinary frequency.  She tried Miralax for constipation, but pain never resolved She was treated empirically for UTI by teledoc with 7 day course of Keflex without improvement.   Denies dysuria, urinary odor, hematuria. Denies fever, chills, malaise LMP May 2019, last intercourse June, reports she is perimenopausal.    Past Medical History:  Diagnosis Date  . Migraine headache 12/13/2017  . Perimenopausal 12/13/2017   Past Surgical History:  Procedure Laterality Date  . BUNIONECTOMY Right 2004  . CESAREAN SECTION    . TONSILLECTOMY    . TUBAL LIGATION     Social History   Tobacco Use  . Smoking status: Never Smoker  . Smokeless tobacco: Never Used  Substance Use Topics  . Alcohol use: No   family history includes Cancer in her maternal grandmother, maternal uncle, mother, paternal grandmother, and paternal uncle; Diabetes in her paternal grandmother; Hyperlipidemia in her mother; Hypertension in her mother.    ROS: negative except as noted in the HPI  Medications: Current Outpatient Medications  Medication Sig Dispense Refill  . Drospirenone-Ethinyl Estradiol-Levomefol (BEYAZ) 3-0.02-0.451 MG tablet   4  . promethazine (PHENERGAN) 25 MG tablet Take 0.5-1 tablets (12.5-25 mg total) by mouth every 4 (four) hours as needed for nausea or vomiting. 30 tablet 1  . SUMAtriptan (IMITREX) 50 MG tablet Take 0.5-1 tablets (25-50 mg total) by mouth daily as needed for migraine (Can repeat dose in 2 hours if headaches persist). 20 tablet 5  . ciprofloxacin (CIPRO) 500 MG tablet Take 1 tablet (500 mg total) by mouth 2 (two) times daily for 7 days.  14 tablet 0   No current facility-administered medications for this visit.    No Known Allergies     Objective:  BP (!) 141/86   Pulse 79   Temp 98.1 F (36.7 C)   Resp 14   Wt 169 lb (76.7 kg)   LMP 01/04/2018 (Approximate)   BMI 33.01 kg/m  Gen:  alert, not ill-appearing, no distress, appropriate for age, obese female HEENT: head normocephalic without obvious abnormality, conjunctiva and cornea clear, trachea midline Pulm: Normal work of breathing, normal phonation, clear to auscultation bilaterally, no wheezes, rales or rhonchi CV: Normal rate, regular rhythm, s1 and s2 distinct, no murmurs, clicks or rubs  GI: abdomen soft, there is RLQ/suprapubic tenderness without guarding, no CVA tenderness Neuro: alert and oriented x 3, no tremor MSK: extremities atraumatic, normal gait and station Skin: intact, no rashes on exposed skin, no jaundice, no cyanosis    Results for orders placed or performed in visit on 03/27/18 (from the past 72 hour(s))  POCT Urinalysis Dipstick     Status: Abnormal   Collection Time: 03/27/18  8:26 AM  Result Value Ref Range   Color, UA light yellow    Clarity, UA clear    Glucose, UA Negative Negative   Bilirubin, UA negative  Ketones, UA negative    Spec Grav, UA <=1.005 (A) 1.010 - 1.025   Blood, UA trace    pH, UA 6.0 5.0 - 8.0   Protein, UA Negative Negative   Urobilinogen, UA 0.2 0.2 or 1.0 E.U./dL   Nitrite, UA negative    Leukocytes, UA Trace (A) Negative   Appearance     Odor    POCT urine pregnancy     Status: Normal   Collection Time: 03/27/18  8:29 AM  Result Value Ref Range   Preg Test, Ur Negative Negative  CBC with Differential/Platelet     Status: None   Collection Time: 03/27/18  8:48 AM  Result Value Ref Range   WBC 6.9 3.8 - 10.8 Thousand/uL   RBC 4.36 3.80 - 5.10 Million/uL   Hemoglobin 12.6 11.7 - 15.5 g/dL   HCT 37.4 35.0 - 45.0 %   MCV 85.8 80.0 - 100.0 fL   MCH 28.9 27.0 - 33.0 pg   MCHC 33.7 32.0 - 36.0  g/dL   RDW 13.5 11.0 - 15.0 %   Platelets 234 140 - 400 Thousand/uL   MPV 11.3 7.5 - 12.5 fL   Neutro Abs 4,720 1,500 - 7,800 cells/uL   Lymphs Abs 1,622 850 - 3,900 cells/uL   WBC mixed population 400 200 - 950 cells/uL   Eosinophils Absolute 131 15 - 500 cells/uL   Basophils Absolute 28 0 - 200 cells/uL   Neutrophils Relative % 68.4 %   Total Lymphocyte 23.5 %   Monocytes Relative 5.8 %   Eosinophils Relative 1.9 %   Basophils Relative 0.4 %  Comprehensive metabolic panel     Status: None   Collection Time: 03/27/18  8:48 AM  Result Value Ref Range   Glucose, Bld 87 65 - 99 mg/dL    Comment: .            Fasting reference interval .    BUN 8 7 - 25 mg/dL   Creat 0.84 0.50 - 1.10 mg/dL   BUN/Creatinine Ratio NOT APPLICABLE 6 - 22 (calc)   Sodium 137 135 - 146 mmol/L   Potassium 4.1 3.5 - 5.3 mmol/L   Chloride 104 98 - 110 mmol/L   CO2 29 20 - 32 mmol/L   Calcium 9.6 8.6 - 10.2 mg/dL   Total Protein 7.1 6.1 - 8.1 g/dL   Albumin 4.6 3.6 - 5.1 g/dL   Globulin 2.5 1.9 - 3.7 g/dL (calc)   AG Ratio 1.8 1.0 - 2.5 (calc)   Total Bilirubin 0.4 0.2 - 1.2 mg/dL   Alkaline phosphatase (APISO) 73 33 - 115 U/L   AST 13 10 - 35 U/L   ALT 9 6 - 29 U/L  Lipase     Status: None   Collection Time: 03/27/18  8:48 AM  Result Value Ref Range   Lipase 15 7 - 60 U/L   No results found.    Assessment and Plan: 47 y.o. female with   Right lower quadrant abdominal pain - Plan: POCT Urinalysis Dipstick, Urine Culture, POCT urine pregnancy, CT Abdomen Pelvis W Contrast, CBC with Differential/Platelet, Comprehensive metabolic panel, Lipase  Urine leukocytes - Plan: ciprofloxacin (CIPRO) 500 MG tablet  Microscopic hematuria - Plan: ciprofloxacin (CIPRO) 500 MG tablet  - afebrile, no tachycardia, mildly hypertensive, RLQ tenderness without guarding or rigidity. U preg negative. UA positive for trace blood and trace leuks. Urine culture pending. Given duration of symptoms will check labs and  obtain CT abdomen to assess  for nephrolithiasis and diverticulitis. Plan to broaden antibiotic coverage with Cipro pending results of urine culture.  Patient education and anticipatory guidance given Patient agrees with treatment plan Follow-up as needed if symptoms worsen or fail to improve  Darlyne Russian PA-C

## 2018-03-28 ENCOUNTER — Encounter: Payer: Self-pay | Admitting: Physician Assistant

## 2018-03-28 LAB — URINE CULTURE
MICRO NUMBER:: 90865628
Result:: NO GROWTH
SPECIMEN QUALITY:: ADEQUATE

## 2018-03-28 MED ORDER — CIPROFLOXACIN HCL 500 MG PO TABS: 500.0000 mg | ORAL_TABLET | Freq: Two times a day (BID) | ORAL | 0 refills | Status: AC

## 2018-05-24 ENCOUNTER — Ambulatory Visit (INDEPENDENT_AMBULATORY_CARE_PROVIDER_SITE_OTHER): Payer: Managed Care, Other (non HMO) | Admitting: Osteopathic Medicine

## 2018-05-24 ENCOUNTER — Encounter: Payer: Self-pay | Admitting: Osteopathic Medicine

## 2018-05-24 VITALS — BP 164/98 | HR 74 | Temp 98.1°F | Wt 174.0 lb

## 2018-05-24 DIAGNOSIS — J0140 Acute pansinusitis, unspecified: Secondary | ICD-10-CM | POA: Diagnosis not present

## 2018-05-24 MED ORDER — DOXYCYCLINE HYCLATE 100 MG PO TABS: 100.0000 mg | ORAL_TABLET | Freq: Two times a day (BID) | ORAL | 0 refills | Status: DC

## 2018-05-24 MED ORDER — IPRATROPIUM BROMIDE 0.06 % NA SOLN: 2.0000 | Freq: Four times a day (QID) | NASAL | 1 refills | Status: DC

## 2018-05-24 NOTE — Patient Instructions (Signed)
Medications & Home Remedies for Upper Respiratory Illness   Note: the following list assumes no pregnancy, normal liver & kidney function and no other drug interactions. Dr. Wyatte Dames has highlighted medications which are safe for you to use, but these may not be appropriate for everyone. Always ask a pharmacist or qualified medical provider if you have any questions!    Aches/Pains, Fever, Headache OTC Acetaminophen (Tylenol) 500 mg tablets - take max 2 tablets (1000 mg) every 6 hours (4 times per day)  OTC Ibuprofen (Motrin) 200 mg tablets - take max 4 tablets (800 mg) every 6 hours*   Sinus Congestion Prescription Atrovent as directed OTC Nasal Saline if desired to rinse OTC Oxymetolazone (Afrin, others) sparing use due to rebound congestion, NEVER use in kids OTC Phenylephrine (Sudafed) 10 mg tablets every 4 hours (or the 12-hour formulation)* OTC Diphenhydramine (Benadryl) 25 mg tablets - take max 2 tablets every 4 hours   Cough & Sore Throat Prescription cough pills or syrups as directed OTC Dextromethorphan (Robitussin, others) - cough suppressant OTC Guaifenesin (Robitussin, Mucinex, others) - expectorant (helps cough up mucus) (Dextromethorphan and Guaifenesin also come in a combination tablet/syrup) OTC Lozenges w/ Benzocaine + Menthol (Cepacol) Honey - as much as you want! Teas which "coat the throat" - look for ingredients Elm Bark, Licorice Root, Marshmallow Root   Other Prescription Oral Steroids to decrease inflammation and improve energy Prescription Antibiotics if these are necessary for bacterial infection - take ALL, even if you're feeling better  OTC Zinc Lozenges within 24 hours of symptoms onset - mixed evidence this shortens the duration of the common cold Don't waste your money on Vitamin C or Echinacea in acute illness - it's already too late!    *Caution in patients with high blood pressure   

## 2018-05-24 NOTE — Progress Notes (Signed)
HPI: Diane Warren is a 47 y.o. female who  has a past medical history of Migraine headache (12/13/2017) and Perimenopausal (12/13/2017).  she presents to [facility] today, 05/24/18,  for chief complaint of: Sinus problem  3 weeks sinus symptoms. Went [facility], still has 2 days of augmentin but this didn't help whatsoever.   HTN: Hers 177/97, ours 505/39 - systolic number not w/in 10 pts 124/71 was what she got at home this morning      Past medical, surgical, social and family history reviewed:  Patient Active Problem List   Diagnosis Date Noted  . Migraine headache 12/13/2017  . Borderline blood pressure 12/13/2017  . Perimenopausal 12/13/2017  . Elevated BP without diagnosis of hypertension 07/13/2017  . Strain of hip adductor muscle, initial encounter 07/22/2016  . Prepatellar bursitis 07/22/2016  . Encounter for annual physical exam 05/21/2016  . Obesity 12/31/2015  . Right knee pain 09/25/2015  . Right hip pain 09/25/2015  . BMI 32.0-32.9,adult 09/25/2015    Past Surgical History:  Procedure Laterality Date  . BUNIONECTOMY Right 2004  . CESAREAN SECTION    . TONSILLECTOMY    . TUBAL LIGATION      Social History   Tobacco Use  . Smoking status: Never Smoker  . Smokeless tobacco: Never Used  Substance Use Topics  . Alcohol use: No    Family History  Problem Relation Age of Onset  . Hypertension Mother   . Hyperlipidemia Mother   . Cancer Mother        Uterine Cancer  . Cancer Maternal Uncle        LUNG  . Cancer Paternal Uncle        SKIN  . Cancer Maternal Grandmother        BREAST  . Cancer Paternal Grandmother        BREAST  . Diabetes Paternal Grandmother      Current medication list and allergy/intolerance information reviewed:    Current Outpatient Medications  Medication Sig Dispense Refill  . amoxicillin-clavulanate (AUGMENTIN) 500-125 MG tablet Take by mouth.    .  Drospirenone-Ethinyl Estradiol-Levomefol (BEYAZ) 3-0.02-0.451 MG tablet   4  . promethazine (PHENERGAN) 25 MG tablet Take 0.5-1 tablets (12.5-25 mg total) by mouth every 4 (four) hours as needed for nausea or vomiting. 30 tablet 1  . SUMAtriptan (IMITREX) 50 MG tablet Take 0.5-1 tablets (25-50 mg total) by mouth daily as needed for migraine (Can repeat dose in 2 hours if headaches persist). 20 tablet 5   No current facility-administered medications for this visit.     No Known Allergies    Review of Systems:  Constitutional:  No  fever, no chills, +recent illness, No unintentional weight changes. +significant fatigue.   HEENT: +headache, no vision change, no hearing change, No sore throat, +sinus pressure  Cardiac: No  chest pain, No  pressure, No palpitations  Respiratory:  No  shortness of breath. No  Cough  Gastrointestinal: No  abdominal pain, No  nausea, No  vomiting,  No  blood in stool, No  diarrhea  Musculoskeletal: No new myalgia/arthralgia  Skin: No  Rash  Neurologic: No  weakness, No  dizziness  Exam:  BP (!) 164/98 (BP Location: Left Arm, Patient Position: Sitting, Cuff Size: Normal)   Pulse 74   Temp 98.1 F (36.7 C) (Oral)   Wt 174 lb (78.9 kg)   BMI 33.98 kg/m   Constitutional: VS see above. General Appearance: alert,  well-developed, well-nourished, NAD  Eyes: Normal lids and conjunctive, non-icteric sclera  Ears, Nose, Mouth, Throat: MMM, Normal external inspection ears/nares/mouth/lips/gums. TM normal bilaterally. Pharynx/tonsils no erythema, no exudate. Nasal mucosa normal.   Neck: No masses, trachea midline. No tenderness/mass appreciated. No lymphadenopathy  Respiratory: Normal respiratory effort. no wheeze, no rhonchi, no rales  Cardiovascular: S1/S2 normal, no murmur, no rub/gallop auscultated. RRR. No lower extremity edema.   Gastrointestinal: Nontender, no masses. Bowel sounds normal.  Musculoskeletal: Gait normal.   Neurological: Normal  balance/coordination. No tremor.   Skin: warm, dry, intact.   Psychiatric: Normal judgment/insight. Normal mood and affect.     ASSESSMENT/PLAN:   Acute pansinusitis, recurrence not specified   Meds ordered this encounter  Medications  . doxycycline (VIBRA-TABS) 100 MG tablet    Sig: Take 1 tablet (100 mg total) by mouth 2 (two) times daily.    Dispense:  14 tablet    Refill:  0  . ipratropium (ATROVENT) 0.06 % nasal spray    Sig: Place 2 sprays into both nostrils 4 (four) times daily.    Dispense:  15 mL    Refill:  1     Patient Instructions  Medications & Home Remedies for Upper Respiratory Illness   Note: the following list assumes no pregnancy, normal liver & kidney function and no other drug interactions. Dr. Sheppard Coil has highlighted medications which are safe for you to use, but these may not be appropriate for everyone. Always ask a pharmacist or qualified medical provider if you have any questions!    Aches/Pains, Fever, Headache OTC Acetaminophen (Tylenol) 500 mg tablets - take max 2 tablets (1000 mg) every 6 hours (4 times per day)  OTC Ibuprofen (Motrin) 200 mg tablets - take max 4 tablets (800 mg) every 6 hours*   Sinus Congestion Prescription Atrovent as directed OTC Nasal Saline if desired to rinse OTC Oxymetolazone (Afrin, others) sparing use due to rebound congestion, NEVER use in kids OTC Phenylephrine (Sudafed) 10 mg tablets every 4 hours (or the 12-hour formulation)* OTC Diphenhydramine (Benadryl) 25 mg tablets - take max 2 tablets every 4 hours   Cough & Sore Throat Prescription cough pills or syrups as directed OTC Dextromethorphan (Robitussin, others) - cough suppressant OTC Guaifenesin (Robitussin, Mucinex, others) - expectorant (helps cough up mucus) (Dextromethorphan and Guaifenesin also come in a combination tablet/syrup) OTC Lozenges w/ Benzocaine + Menthol (Cepacol) Honey - as much as you want! Teas which "coat the throat" - look for  ingredients Elm Bark, Licorice Root, Marshmallow Root   Other Prescription Oral Steroids to decrease inflammation and improve energy Prescription Antibiotics if these are necessary for bacterial infection - take ALL, even if you're feeling better  OTC Zinc Lozenges within 24 hours of symptoms onset - mixed evidence this shortens the duration of the common cold Don't waste your money on Vitamin C or Echinacea in acute illness - it's already too late!    *Caution in patients with high blood pressure       Visit summary with medication list and pertinent instructions was printed for patient to review. All questions at time of visit were answered - patient instructed to contact office with any additional concerns. ER/RTC precautions were reviewed with the patient.   Follow-up plan: Return if symptoms worsen or fail to improve.    Please note: voice recognition software was used to produce this document, and typos may escape review. Please contact Dr. Sheppard Coil for any needed clarifications.

## 2018-06-05 ENCOUNTER — Encounter: Payer: Self-pay | Admitting: Physician Assistant

## 2018-06-05 ENCOUNTER — Ambulatory Visit (INDEPENDENT_AMBULATORY_CARE_PROVIDER_SITE_OTHER): Payer: Managed Care, Other (non HMO) | Admitting: Physician Assistant

## 2018-06-05 VITALS — BP 159/91 | HR 79 | Temp 98.2°F | Wt 172.0 lb

## 2018-06-05 DIAGNOSIS — I10 Essential (primary) hypertension: Secondary | ICD-10-CM

## 2018-06-05 DIAGNOSIS — L271 Localized skin eruption due to drugs and medicaments taken internally: Secondary | ICD-10-CM

## 2018-06-05 MED ORDER — METHYLPREDNISOLONE SODIUM SUCC 125 MG IJ SOLR
125.0000 mg | Freq: Once | INTRAMUSCULAR | Status: AC
  Administered 2018-06-05: 125 mg via INTRAMUSCULAR

## 2018-06-05 MED ORDER — METHYLPREDNISOLONE ACETATE 40 MG/ML IJ SUSP
80.0000 mg | Freq: Once | INTRAMUSCULAR | Status: AC
  Administered 2018-06-05: 80 mg via INTRAMUSCULAR

## 2018-06-05 NOTE — Patient Instructions (Signed)
Drug Allergy A drug allergy is when the body's disease-fighting system (immune system) reacts badly to a medicine. Drug allergies range from mild to severe, and they can be life-threatening in some cases. Some allergic reactions occur one week or more after you are exposed to a medicine (delayed reaction). A sudden (acute), severe allergic reaction that affects multiple areas of the body is called an anaphylactic reaction (anaphylaxis). Anaphylaxis can be life-threatening. All allergic reactions to a medicine require medical evaluation, even if an allergic reaction appears to be mild (minor). What are the causes? Drug allergies happen when the immune system wrongly identifies a medicine as being harmful. When this happens, the body releases proteins (antibodies) and other compounds, such as histamine, into the bloodstream. This causes swelling in certain tissues and loss of blood pressure to important areas, such as the heart and lungs. Almost any medicine can cause an allergic reaction. Medicines that commonly cause allergic reactions (are common allergens) include:  Penicillin.  Sulfa medicines (sulfonamides).  Medicines that numb certain areas of the body (local anesthetics).  X-ray dyes that contain iodine.  What are the signs or symptoms? Mild Allergic Reaction  Nasal congestion.  Tingling in the mouth.  An itchy, red rash. Severe Allergic Reaction  Swelling of the eyes, lips, face, or tongue.  Swelling of the back of the mouth and the throat.  Wheezing.  A hoarse voice.  Itchy, red, swollen areas of skin (hives).  Dizziness or light-headedness.  Fainting.  Anxiety or confusion.  Abdominal pain.  Difficulty breathing, speaking, or swallowing.  Chest tightness.  Fast or irregular heartbeats (palpitations).  Vomiting.  Diarrhea. How is this diagnosed? This condition is diagnosed from a physical exam and your history of recent exposure to one or more medicines.  You may be referred for follow-up testing by a health care provider who specializes in allergies. This testing can confirm the diagnosis of a drug allergy and determine which medicines you are allergic to. Testing may include:  Skin tests. These may involve: ? Injecting a small amount of the possible allergen between layers of your skin (intradermal injection). ? Applying patches to your skin.  Blood tests.  Drug challenge. For this test, a health care provider gives you a small amount of a medicine in gradual doses while watching for an allergic reaction.  If you are unsure of what caused your allergic reaction, your health care provider may ask you for:  Information about all medicines that you take on a regular basis, including: ? The name of each medicine. ? How much (dosage) and how many times you take each medicine per day. ? The form of each medicine, such as pill, liquid, or injection.  The date and time of your reaction.  How is this treated? There is no cure for allergies. However, an allergic reaction can be treated with:  Medicines that help: ? To reduce pain and swelling (NSAIDs). ? To relieve itching and hives (antihistamines). ? To reduce swelling (corticosteroids).  Respiratory inhalers. These are inhaled medicines that help to widen (dilate) the airways in your lungs.  Injections of medicine that helps to relax the muscles in your airways and tighten your blood vessels (epinephrine).  Severe allergic reactions, such as anaphylaxis, require immediate treatment in a hospital. If you have an anaphylactic reaction, you may need to be hospitalized for observation. You may be prescribed rescue medicines, such as epinephrine. Epinephrine comes in many forms, including what is commonly called an auto-injector "pen" (pre-filled automatic  epinephrine injection device). Follow these instructions at home: If You Have a Severe Allergy  Always keep an auto-injector pen or your  anaphylaxis kit near you. These can be lifesaving if you have a severe reaction. Use your auto-injector pen or anaphylaxis kit as told by your health care provider.  Make sure that you, the members of your household, and your employer know: ? How to use an anaphylaxis kit. ? How to use an auto-injector pen to give you an epinephrine injection.  Replace your epinephrine immediately after you use your auto-injector pen, in case you have another reaction.  Wear a medical alert bracelet or necklace that states your drug allergy, if told by your health care provider. General instructions  Avoidmedicines that you are allergic to.  Take over-the-counter and prescription medicines only as told by your health care provider.  If you were given medicines to treat your reaction, do not drive until your health care provider approves.  If you have hives or a rash: ? Use an over-the-counter antihistamineas told by your health care provider. ? Apply cold, wet cloths (cold compresses) to your skin or take baths or showers in cool water. Avoid hot water.  If you had tests done, it is your responsibility to get your test results. Ask your health care provider when your results will be ready.  Tell any health care providers who care for you that you have a drug allergy.  Keep all follow-up visits as told by your health care provider. This is important. Contact a health care provider if:  You think that you are having an allergic reaction. Symptoms of an allergic reaction usually start within 30 minutes after you are exposed to a medicine.  You have symptoms that last more than 2 days after your reaction.  Your symptoms get worse.  You develop new symptoms. Get help right away if:  You needed to use epinephrine. ? An epinephrine injection helps to manage life-threatening allergic reactions, but you still need to go to the emergency room even if epinephrine seems to work. This is important because  anaphylaxis may happen again within 72 hours (rebound anaphylaxis). ? If you used epinephrine to treat anaphylaxis outside of the hospital, you need additional medical care. This may include more doses of epinephrine.  You develop symptoms of a severe allergic reaction. These symptoms may represent a serious problem that is an emergency. Do not wait to see if the symptoms will go away. Use your auto-injector pen or anaphylaxis kit as you have been instructed, and get medical help right away. Call your local emergency services (911 in the [place] Do not drive yourself to the hospital. This information is not intended to replace advice given to you by your health care provider. Make sure you discuss any questions you have with your health care provider. Document Released: 08/23/2005 Document Revised: 01/02/2016 Document Reviewed: 03/25/2015 Elsevier Interactive Patient Education  [employer].

## 2018-06-05 NOTE — Progress Notes (Signed)
HPI:                                                                Diane Warren is a 47 y.o. female who presents to [facility] [facility] today for rash/pruritus  Treated for acute sinusitis on 05/24/18 (12 days ago) with Doxycycline. Prior to this she was treated in urgent care for sinusitis on 05/18/18 with Augmentin, but was switched to Doxycline due to persistent symptoms. No prior history of drug allergy.  Began having pruritus/irritated skin and red rash on bilateral arms on Friday. This was about 1 day after completing Doxycycline. Symptoms are unchanged. Pruritus is interfering with sleep. She has tried multiple moisturizers, topical corticosteroids and benadryl without much relief.   She has a history of eczema Denies fever, malaise, facial/lip/tongue swelling, conjunctivitis, dysuria.  Past Medical History:  Diagnosis Date  . Migraine headache 12/13/2017  . Perimenopausal 12/13/2017   Past Surgical History:  Procedure Laterality Date  . BUNIONECTOMY Right 2004  . CESAREAN SECTION    . TONSILLECTOMY    . TUBAL LIGATION     Social History   Tobacco Use  . Smoking status: Never Smoker  . Smokeless tobacco: Never Used  Substance Use Topics  . Alcohol use: No   family history includes Cancer in her maternal grandmother, maternal uncle, mother, paternal grandmother, and paternal uncle; Diabetes in her paternal grandmother; Hyperlipidemia in her mother; Hypertension in her mother.    ROS: negative except as noted in the HPI  Medications: Current Outpatient Medications  Medication Sig Dispense Refill  . Drospirenone-Ethinyl Estradiol-Levomefol (BEYAZ) 3-0.02-0.451 MG tablet   4  . ipratropium (ATROVENT) 0.06 % nasal spray Place 2 sprays into both nostrils 4 (four) times daily. 15 mL 1  . promethazine (PHENERGAN) 25 MG tablet Take 0.5-1 tablets (12.5-25 mg total) by mouth every 4 (four) hours as needed for nausea or vomiting. 30 tablet 1   . SUMAtriptan (IMITREX) 50 MG tablet Take 0.5-1 tablets (25-50 mg total) by mouth daily as needed for migraine (Can repeat dose in 2 hours if headaches persist). 20 tablet 5   No current facility-administered medications for this visit.    Allergies  Allergen Reactions  . Doxycycline Rash       Objective:  BP (!) 159/91   Pulse 79   Temp 98.2 F (36.8 C) (Oral)   Wt 172 lb (78 kg)   BMI 33.59 kg/m  Gen:  alert, not ill-appearing, no distress, appropriate for age 34: head normocephalic without obvious abnormality, no facial/lip edema, conjunctiva and cornea clear, oropharynx clear, moist mucous membranes, trachea midline Pulm: Normal work of breathing, normal phonation, clear to auscultation bilaterally, no wheezes, rales or rhonchi CV: Normal rate, regular rhythm, s1 and s2 distinct, no murmurs, clicks or rubs  Neuro: alert and oriented x 3, no tremor MSK: extremities atraumatic, normal gait and station Skin: faintly red papular, sandpapery rash of bilateral upper extremities/shoulders/upper back    No results found for this or any previous visit (from the past 72 hour(s)). No results found.    Assessment and Plan: 47 y.o. female with   .Diane Warren was seen today for rash.  Diagnoses and all orders for this visit:  Fixed drug eruption -  methylPREDNISolone acetate (DEPO-MEDROL) injection 80 mg -     methylPREDNISolone sodium succinate (SOLU-MEDROL) 125 mg/2 mL injection 125 mg  Uncontrolled hypertension   Tetracycline most likely offending agent No evidence of mucous membrane involvement IM Solumedrol and Depomedrol given in office today   Patient education and anticipatory guidance given Patient agrees with treatment plan Follow-up with PCP in 2 weeks for HTN or sooner as needed if symptoms worsen or fail to improve  Darlyne Russian PA-C

## 2018-06-08 ENCOUNTER — Telehealth: Payer: Self-pay

## 2018-06-08 DIAGNOSIS — L299 Pruritus, unspecified: Secondary | ICD-10-CM

## 2018-06-08 MED ORDER — HYDROXYZINE HCL 50 MG PO TABS: 25.0000 mg | ORAL_TABLET | Freq: Every evening | ORAL | 0 refills | Status: DC | PRN

## 2018-06-08 NOTE — Telephone Encounter (Signed)
Pt was seen on Monday for a rash.  She stated that the rash is visibly gone but she is still very itchy.  Is she asking is there anything else she could be doing to help with the itching. Please advise. -EH/RMA

## 2018-06-08 NOTE — Telephone Encounter (Signed)
She can try taking Zyrtec during the day and Hydroxyzine at bedtime, but unfortunately there is not much that helps with itching because this is due to a drug eruption, not histamine. She can also try cool compresses, Aveeno baths, and Calamine lotion

## 2018-06-19 ENCOUNTER — Ambulatory Visit (INDEPENDENT_AMBULATORY_CARE_PROVIDER_SITE_OTHER): Payer: Managed Care, Other (non HMO) | Admitting: Osteopathic Medicine

## 2018-06-19 ENCOUNTER — Encounter: Payer: Self-pay | Admitting: Osteopathic Medicine

## 2018-06-19 VITALS — BP 133/83 | HR 76 | Temp 98.6°F | Wt 174.3 lb

## 2018-06-19 DIAGNOSIS — R030 Elevated blood-pressure reading, without diagnosis of hypertension: Secondary | ICD-10-CM | POA: Diagnosis not present

## 2018-06-19 DIAGNOSIS — Z0000 Encounter for general adult medical examination without abnormal findings: Secondary | ICD-10-CM

## 2018-06-19 DIAGNOSIS — L271 Localized skin eruption due to drugs and medicaments taken internally: Secondary | ICD-10-CM

## 2018-06-19 DIAGNOSIS — Z23 Encounter for immunization: Secondary | ICD-10-CM

## 2018-06-19 DIAGNOSIS — R635 Abnormal weight gain: Secondary | ICD-10-CM

## 2018-06-19 MED ORDER — FLUTICASONE PROPIONATE 50 MCG/ACT NA SUSP: 1.0000 | Freq: Every day | NASAL | 3 refills | Status: DC

## 2018-06-19 NOTE — Progress Notes (Signed)
HPI: Diane Warren is a 47 y.o. female who  has a past medical history of Migraine headache (12/13/2017) and Perimenopausal (12/13/2017).  she presents to [facility] today, 06/19/18,  for chief complaint of:  Follow up HTN, drug rash  Recently seen by colleague for drug reaction to doxycycline.  Rash has essentially resolved, she still has a couple spots of itching but overall doing much better.  Sinus pressure/pain is improved but not resolved.  She is still taking the Atrovent.  Blood pressure was elevated at previous visit, normal range today.  She has made some good changes as far as restricting salt in diet, increasing protein and fiber, minimizing sugars and fats.  Past medical history, surgical history, and family history reviewed.  Current medication list and allergy/intolerance information reviewed.   (See remainder of HPI, ROS, Phys Exam below)      ASSESSMENT/PLAN:   Elevated blood pressure reading - Plan: CBC, COMPLETE METABOLIC PANEL WITH GFR, Lipid panel, TSH  Fixed drug eruption - Plan: CBC, COMPLETE METABOLIC PANEL WITH GFR, Lipid panel, TSH  Flu vaccine need - Plan: CBC, COMPLETE METABOLIC PANEL WITH GFR, Lipid panel, TSH  Weight gain - Plan: CBC, COMPLETE METABOLIC PANEL WITH GFR, Lipid panel, TSH  Annual physical exam - Labs ordered for future visit, preventive care was not performed or coded today - Plan: CBC, COMPLETE METABOLIC PANEL WITH GFR, Lipid panel, TSH  Need for influenza vaccination - Plan: Flu Vaccine QUAD 6+ mos PF IM (Fluarix Quad PF)   No orders of the defined types were placed in this encounter.   Patient Instructions  Plan:  For sinus congestion/seasonal allergies, Flonase nasal spray may work a bit better.  Can also add over-the-counter antihistamine to this, such as Claritin/Allegra/Zyrtec or generic versions of these.  If sinus symptoms worsen, may consider CT scan of the sinus/ENT specialist  referral.  Keep up the good work with the healthy diet.  I think you are most likely gaining some muscle mass given all the protein/fiber that you are eating.  Try evaluating calories and can portion control further to assist with fat burning.   Follow-up plan: Return for annual physical sometime next couple months (last was 06/2017) can get fasting labs prior to visit.               ############################################ ############################################ ############################################ ############################################    Outpatient Encounter Medications as of 06/19/2018  Medication Sig Note  . hydrOXYzine (ATARAX/VISTARIL) 50 MG tablet Take 0.5-1 tablets (25-50 mg total) by mouth at bedtime as needed for itching. For itching.   Marland Kitchen ipratropium (ATROVENT) 0.06 % nasal spray Place 2 sprays into both nostrils 4 (four) times daily.   . promethazine (PHENERGAN) 25 MG tablet Take 0.5-1 tablets (12.5-25 mg total) by mouth every 4 (four) hours as needed for nausea or vomiting. 06/19/2018: As per pt, PRN  . SUMAtriptan (IMITREX) 50 MG tablet Take 0.5-1 tablets (25-50 mg total) by mouth daily as needed for migraine (Can repeat dose in 2 hours if headaches persist). 06/19/2018: As per pt, PRN  . Drospirenone-Ethinyl Estradiol-Levomefol (BEYAZ) 3-0.02-0.451 MG tablet     No facility-administered encounter medications on file as of 06/19/2018.    Allergies  Allergen Reactions  . Doxycycline Rash      Review of Systems:  Constitutional: +recent illness doing a bit better  HEENT: No  headache, no vision change, + sinus pressure as per HPI  Cardiac: No  chest pain, No  pressure, No  palpitations  Respiratory:  No shortness of breath. No  Cough  Gastrointestinal: No  abdominal pain, no change on bowel habits  Musculoskeletal: No new myalgia/arthralgia  Skin: +improving Rash  Hem/Onc: No  easy bruising/bleeding, No  abnormal  lumps/bumps  Neurologic: No  weakness, No  Dizziness  Psychiatric: No  concerns with depression, No  concerns with anxiety  Exam:  BP 133/83 (BP Location: Left Arm, Patient Position: Sitting, Cuff Size: Normal)   Pulse 76   Temp 98.6 F (37 C) (Oral)   Wt 174 lb 4.8 oz (79.1 kg)   BMI 34.04 kg/m   Constitutional: VS see above. General Appearance: alert, well-developed, well-nourished, NAD  Eyes: Normal lids and conjunctive, non-icteric sclera  Ears, Nose, Mouth, Throat: MMM, Normal external inspection ears/nares/mouth/lips/gums.  Nasal mucosa normal, some increased congestion on right side turbinates compared to left  Neck: No masses, trachea midline.   Respiratory: Normal respiratory effort. no wheeze, no rhonchi, no rales  Cardiovascular: S1/S2 normal, no murmur, no rub/gallop auscultated. RRR.   Musculoskeletal: Gait normal. Symmetric and independent movement of all extremities  Neurological: Normal balance/coordination. No tremor.  Skin: warm, dry, intact.   Psychiatric: Normal judgment/insight. Normal mood and affect. Oriented x3.   Visit summary with medication list and pertinent instructions was printed for patient to review, advised to alert [place] if any changes needed. All questions at time of visit were answered - patient instructed to contact office with any additional concerns. ER/RTC precautions were reviewed with the patient and understanding verbalized.   Follow-up plan: Return for annual physical sometime next couple months (last was 06/2017) can get fasting labs prior to visit.  Note: Total time spent 25 minutes, greater than 50% of the visit was spent face-to-face counseling and coordinating care for the following: The primary encounter diagnosis was Elevated blood pressure reading. Diagnoses of Fixed drug eruption, Flu vaccine need, Weight gain, Annual physical exam, and Need for influenza vaccination were also pertinent to this visit.Marland Kitchen  Please note: voice  recognition software was used to produce this document, and typos may escape review. Please contact Dr. Sheppard Coil for any needed clarifications.

## 2018-06-19 NOTE — Patient Instructions (Signed)
Plan:  For sinus congestion/seasonal allergies, Flonase nasal spray may work a bit better.  Can also add over-the-counter antihistamine to this, such as Claritin/Allegra/Zyrtec or generic versions of these.  If sinus symptoms worsen, may consider CT scan of the sinus/ENT specialist referral.  Keep up the good work with the healthy diet.  I think you are most likely gaining some muscle mass given all the protein/fiber that you are eating.  Try evaluating calories and can portion control further to assist with fat burning.

## 2018-07-25 ENCOUNTER — Other Ambulatory Visit: Payer: Self-pay | Admitting: Osteopathic Medicine

## 2018-07-26 LAB — HM PAP SMEAR

## 2018-08-07 ENCOUNTER — Encounter: Payer: Managed Care, Other (non HMO) | Admitting: Osteopathic Medicine

## 2018-08-17 ENCOUNTER — Encounter: Payer: Managed Care, Other (non HMO) | Admitting: Osteopathic Medicine

## 2018-09-18 ENCOUNTER — Telehealth: Payer: Self-pay | Admitting: Osteopathic Medicine

## 2018-09-18 NOTE — Telephone Encounter (Signed)
Yes, labs already ordered Pt can go to lab anytime

## 2018-09-18 NOTE — Telephone Encounter (Signed)
Patient would like labs ordered prior to her physical appointment on 10/05/2018. Please contact and advise.

## 2018-09-18 NOTE — Telephone Encounter (Signed)
Left voicemail with information below.

## 2018-09-18 NOTE — Telephone Encounter (Signed)
Labs already pended.

## 2018-09-18 NOTE — Telephone Encounter (Signed)
Patient called to cancel physical for tomorrow. Did not give a reason and she has rescheduled.

## 2018-09-19 ENCOUNTER — Encounter: Payer: Managed Care, Other (non HMO) | Admitting: Osteopathic Medicine

## 2018-09-30 LAB — CBC
HCT: 36.9 % (ref 35.0–45.0)
Hemoglobin: 12.6 g/dL (ref 11.7–15.5)
MCH: 30.2 pg (ref 27.0–33.0)
MCHC: 34.1 g/dL (ref 32.0–36.0)
MCV: 88.5 fL (ref 80.0–100.0)
MPV: 11.6 fL (ref 7.5–12.5)
Platelets: 231 10*3/uL (ref 140–400)
RBC: 4.17 10*6/uL (ref 3.80–5.10)
RDW: 13.1 % (ref 11.0–15.0)
WBC: 6.5 10*3/uL (ref 3.8–10.8)

## 2018-09-30 LAB — COMPLETE METABOLIC PANEL WITH GFR
AG Ratio: 1.9 (calc) (ref 1.0–2.5)
ALT: 9 U/L (ref 6–29)
AST: 12 U/L (ref 10–35)
Albumin: 4.3 g/dL (ref 3.6–5.1)
Alkaline phosphatase (APISO): 65 U/L (ref 33–115)
BUN: 12 mg/dL (ref 7–25)
CO2: 24 mmol/L (ref 20–32)
Calcium: 9.2 mg/dL (ref 8.6–10.2)
Chloride: 106 mmol/L (ref 98–110)
Creat: 0.76 mg/dL (ref 0.50–1.10)
GFR, Est African American: 108 mL/min/{1.73_m2} (ref >=60)
GFR, Est Non African American: 93 mL/min/{1.73_m2} (ref >=60)
Globulin: 2.3 g/dL (calc) (ref 1.9–3.7)
Glucose, Bld: 86 mg/dL (ref 65–99)
Potassium: 4.4 mmol/L (ref 3.5–5.3)
Sodium: 139 mmol/L (ref 135–146)
Total Bilirubin: 0.3 mg/dL (ref 0.2–1.2)
Total Protein: 6.6 g/dL (ref 6.1–8.1)

## 2018-09-30 LAB — LIPID PANEL
Cholesterol: 195 mg/dL (ref <200)
HDL: 52 mg/dL (ref >50)
LDL Cholesterol (Calc): 127 mg/dL (calc) — ABNORMAL HIGH
Non-HDL Cholesterol (Calc): 143 mg/dL (calc) — ABNORMAL HIGH (ref <130)
Total CHOL/HDL Ratio: 3.8 (calc) (ref <5.0)
Triglycerides: 67 mg/dL (ref <150)

## 2018-09-30 LAB — TSH: TSH: 1.46 mIU/L

## 2018-10-05 ENCOUNTER — Encounter: Payer: Self-pay | Admitting: Osteopathic Medicine

## 2018-10-05 ENCOUNTER — Ambulatory Visit (INDEPENDENT_AMBULATORY_CARE_PROVIDER_SITE_OTHER): Payer: Managed Care, Other (non HMO) | Admitting: Osteopathic Medicine

## 2018-10-05 VITALS — BP 135/83 | HR 81 | Temp 98.2°F | Wt 174.8 lb

## 2018-10-05 DIAGNOSIS — Z0000 Encounter for general adult medical examination without abnormal findings: Secondary | ICD-10-CM

## 2018-10-05 NOTE — Progress Notes (Signed)
HPI: Diane Warren is a 48 y.o. female who  has a past medical history of Migraine headache (12/13/2017) and Perimenopausal (12/13/2017).  she presents to [facility] today, 10/05/18,  for chief complaint of: Annual Physical  Patient here for annual physical / wellness exam.  See preventive care reviewed as below.  Labs reviewed - no concerns   Additional concerns today include:   Knee pain - will be seeing Dr T next week.   Mild headache, happens when she comes off her cycle  Sees OBGYN for Pap, done last fall, we don't have records on file - Pap ok but (+)trich which was treated   Mammo is ordered, she has appt tomorrow   BP Readings from Last 3 Encounters:  10/05/18 135/83  06/19/18 133/83  06/05/18 (!) 159/91       Past medical, surgical, social and family history reviewed:  Patient Active Problem List   Diagnosis Date Noted  . Migraine headache 12/13/2017  . Borderline blood pressure 12/13/2017  . Perimenopausal 12/13/2017  . Elevated BP without diagnosis of hypertension 07/13/2017  . Strain of hip adductor muscle, initial encounter 07/22/2016  . Prepatellar bursitis 07/22/2016  . Encounter for annual physical exam 05/21/2016  . Obesity 12/31/2015  . Right knee pain 09/25/2015  . Right hip pain 09/25/2015  . BMI 32.0-32.9,adult 09/25/2015    Past Surgical History:  Procedure Laterality Date  . BUNIONECTOMY Right 2004  . CESAREAN SECTION    . TONSILLECTOMY    . TUBAL LIGATION      Social History   Tobacco Use  . Smoking status: Never Smoker  . Smokeless tobacco: Never Used  Substance Use Topics  . Alcohol use: No    Family History  Problem Relation Age of Onset  . Hypertension Mother   . Hyperlipidemia Mother   . Cancer Mother        Uterine Cancer  . Cancer Maternal Uncle        LUNG  . Cancer Paternal Uncle        SKIN  . Cancer Maternal Grandmother        BREAST  . Cancer Paternal Grandmother    BREAST  . Diabetes Paternal Grandmother      Current medication list and allergy/intolerance information reviewed:    Current Outpatient Medications  Medication Sig Dispense Refill  . fluticasone (FLONASE) 50 MCG/ACT nasal spray Place 1-2 sprays into both nostrils daily. 48 g 3  . ipratropium (ATROVENT) 0.06 % nasal spray 2 SPRAYS EACH NOSTRIL 4 TIMES A DAY 10 mL 1  . promethazine (PHENERGAN) 25 MG tablet Take 0.5-1 tablets (12.5-25 mg total) by mouth every 4 (four) hours as needed for nausea or vomiting. 30 tablet 1  . SUMAtriptan (IMITREX) 50 MG tablet Take 0.5-1 tablets (25-50 mg total) by mouth daily as needed for migraine (Can repeat dose in 2 hours if headaches persist). 20 tablet 5  . Drospirenone-Ethinyl Estradiol-Levomefol (BEYAZ) 3-0.02-0.451 MG tablet   4  . hydrOXYzine (ATARAX/VISTARIL) 50 MG tablet Take 0.5-1 tablets (25-50 mg total) by mouth at bedtime as needed for itching. For itching. (Patient not taking: Reported on 10/05/2018) 10 tablet 0   No current facility-administered medications for this visit.     Allergies  Allergen Reactions  . Doxycycline Rash      Review of Systems:  Constitutional:  No  fever, no chills, No recent illness, No unintentional weight changes. No significant fatigue.   HEENT: +occasional headache, no vision change,  no hearing change, No sore throat, No  sinus pressure  Cardiac: No  chest pain, No  pressure, No palpitations, No  Orthopnea  Respiratory:  No  shortness of breath. No  Cough  Gastrointestinal: No  abdominal pain, No  nausea, No  vomiting,  No  blood in stool, No  diarrhea, No  constipation   Musculoskeletal: No new myalgia/arthralgia  Skin: No  Rash, No other wounds/concerning lesions  Genitourinary: No  incontinence, No  abnormal genital bleeding, No abnormal genital discharge  Hem/Onc: No  easy bruising/bleeding, No  abnormal lymph node  Endocrine: No cold intolerance,  No heat intolerance. No  polyuria/polydipsia/polyphagia   Neurologic: No  weakness, No  dizziness, No  slurred speech/focal weakness/facial droop  Psychiatric: No  concerns with depression, No  concerns with anxiety, No sleep problems, No mood problems  Exam:  BP 135/83 (BP Location: Left Arm, Patient Position: Sitting, Cuff Size: Normal)   Pulse 81   Temp 98.2 F (36.8 C) (Oral)   Wt 174 lb 12.8 oz (79.3 kg)   BMI 34.14 kg/m   Constitutional: VS see above. General Appearance: alert, well-developed, well-nourished, NAD  Eyes: Normal lids and conjunctive, non-icteric sclera  Ears, Nose, Mouth, Throat: MMM, Normal external inspection ears/nares/mouth/lips/gums. TM normal bilaterally. Pharynx/tonsils no erythema, no exudate. Nasal mucosa normal.   Neck: No masses, trachea midline. No thyroid enlargement. No tenderness/mass appreciated. No lymphadenopathy  Respiratory: Normal respiratory effort. no wheeze, no rhonchi, no rales  Cardiovascular: S1/S2 normal, no murmur, no rub/gallop auscultated. RRR. No lower extremity edema. Pedal pulse II/IV bilaterally DP and PT. No carotid bruit or JVD. No abdominal aortic bruit.  Gastrointestinal: Nontender, no masses. No hepatomegaly, no splenomegaly. No hernia appreciated. Bowel sounds normal. Rectal exam deferred.   Musculoskeletal: Gait normal. No clubbing/cyanosis of digits.   Neurological: Normal balance/coordination. No tremor. No cranial nerve deficit on limited exam. Motor and sensation intact and symmetric. Cerebellar reflexes intact.   Skin: warm, dry, intact. No rash/ulcer. No concerning nevi or subq nodules on limited exam.    Psychiatric: Normal judgment/insight. Normal mood and affect. Oriented x3.    No results found for this or any previous visit (from the past 72 hour(s)).  No results found.   ASSESSMENT/PLAN: The encounter diagnosis was Annual physical exam.  The 10-year ASCVD risk score Mikey Bussing DC Brooke Bonito., et al., 2013) is: 2.1%   Values used to  calculate the score:     Age: 67 years     Sex: Female     Is Non-Hispanic African American: Yes     Diabetic: No     Tobacco smoker: No     Systolic Blood Pressure: 315 mmHg     Is BP treated: No     HDL Cholesterol: 52 mg/dL     Total Cholesterol: 195 mg/dL  Recent Results (from the past 2160 hour(s))  CBC     Status: None   Collection Time: 09/29/18  8:08 AM  Result Value Ref Range   WBC 6.5 3.8 - 10.8 Thousand/uL   RBC 4.17 3.80 - 5.10 Million/uL   Hemoglobin 12.6 11.7 - 15.5 g/dL   HCT 36.9 35.0 - 45.0 %   MCV 88.5 80.0 - 100.0 fL   MCH 30.2 27.0 - 33.0 pg   MCHC 34.1 32.0 - 36.0 g/dL   RDW 13.1 11.0 - 15.0 %   Platelets 231 140 - 400 Thousand/uL   MPV 11.6 7.5 - 12.5 fL  COMPLETE METABOLIC PANEL WITH GFR  Status: None   Collection Time: 09/29/18  8:08 AM  Result Value Ref Range   Glucose, Bld 86 65 - 99 mg/dL    Comment: .            Fasting reference interval .    BUN 12 7 - 25 mg/dL   Creat 0.76 0.50 - 1.10 mg/dL   GFR, Est Non African American 93 > OR = 60 mL/min/1.7m2   GFR, Est African American 108 > OR = 60 mL/min/1.51m2   BUN/Creatinine Ratio NOT APPLICABLE 6 - 22 (calc)   Sodium 139 135 - 146 mmol/L   Potassium 4.4 3.5 - 5.3 mmol/L   Chloride 106 98 - 110 mmol/L   CO2 24 20 - 32 mmol/L   Calcium 9.2 8.6 - 10.2 mg/dL   Total Protein 6.6 6.1 - 8.1 g/dL   Albumin 4.3 3.6 - 5.1 g/dL   Globulin 2.3 1.9 - 3.7 g/dL (calc)   AG Ratio 1.9 1.0 - 2.5 (calc)   Total Bilirubin 0.3 0.2 - 1.2 mg/dL   Alkaline phosphatase (APISO) 65 33 - 115 U/L   AST 12 10 - 35 U/L   ALT 9 6 - 29 U/L  Lipid panel     Status: Abnormal   Collection Time: 09/29/18  8:08 AM  Result Value Ref Range   Cholesterol 195 <200 mg/dL   HDL 52 >50 mg/dL   Triglycerides 67 <150 mg/dL   LDL Cholesterol (Calc) 127 (H) mg/dL (calc)    Comment: Reference range: <100 . Desirable range <100 mg/dL for primary prevention;   <70 mg/dL for patients with CHD or diabetic patients  with > or = 2  CHD risk factors. Marland Kitchen LDL-C is now calculated using the Martin-Hopkins  calculation, which is a validated novel method providing  better accuracy than the Friedewald equation in the  estimation of LDL-C.  Cresenciano Genre et al. Annamaria Helling. 8182;993(71): 2061-2068  (http://education.[facility].com/faq/FAQ164)    Total CHOL/HDL Ratio 3.8 <5.0 (calc)   Non-HDL Cholesterol (Calc) 143 (H) <130 mg/dL (calc)    Comment: For patients with diabetes plus 1 major ASCVD risk  factor, treating to a non-HDL-C goal of <100 mg/dL  (LDL-C of <70 mg/dL) is considered a therapeutic  option.   TSH     Status: None   Collection Time: 09/29/18  8:08 AM  Result Value Ref Range   TSH 1.46 mIU/L    Comment:           Reference Range .           > or = 20 Years  0.40-4.50 .                Pregnancy Ranges           First trimester    0.26-2.66           Second trimester   0.55-2.73           Third trimester    0.43-2.91      Patient Instructions  General Preventive Care  Most recent routine screening lipids/other labs: ordered today   Everyone should have blood pressure checked once per year.   Tobacco: don't!   Alcohol: responsible moderation is ok for most adults - if you have concerns about your alcohol intake, please talk to me!   Exercise: as tolerated to reduce risk of cardiovascular disease and diabetes. Strength training will also prevent osteoporosis.   Mental health: if need for mental health care (medicines, counseling, other),  or concerns about moods, please let me know!   Sexual health: if need for STD testing, or if concerns with libido/pain problems, please let me or OBGYN know!   Advanced Directive: Living Will and/or Healthcare Power of Attorney recommended for all adults, regardless of age or health.  Vaccines  Flu vaccine: recommended for almost everyone, every fall.   Shingles vaccine: Shingrix recommended after age 36.  Pneumonia vaccines: Prevnar and Pneumovax recommended  after age 84, or sooner if certain medical conditions.  Tetanus booster: Tdap recommended every 10 years. Done 07/2016 Cancer screenings   Colon cancer screening: recommended for everyone at age 39. Consider sooner given family history polyps.   Breast cancer screening: mammogram recommended at age 70 every other year at least, and annually after age 3.   Cervical cancer screening: Pap every 1 to 5 years depending on age and other risk factors. Can usually stop at age 32 or w/ hysterectomy. Follow up as directed by OBGYN. Will request records.   Lung cancer screening: not needed for non-smokers  Infection screenings . HIV, Gonorrhea/Chlamydia: screening as needed . Hepatitis C: recommended for anyone born 05-1965 - not needed . TB: certain at-risk populations, or depending on work requirements and/or travel history Other . Bone Density Test: recommended for women at age 14            Weight loss: important things to remember  It is hard work! You will have setbacks, but don't get discouraged. The goal is not short-term success, it is long-term health.   Looking at the numbers is important to track your progress and set goals, but how you are feeling and your overall health are the most important things! BMI and pounds and calories and miles logged aren't everything - they are tools to help [place] reach your goals.  You can do this!!!   Things to remember for exercise for weight loss:   Please note - I am not a certified personal trainer. I can present you with ideas and general workout goals, but an exercise program is largely up to you. Find something you can stick with, and something you enjoy!   As you progress in your exercise regimen think about gradually increasing the following, week by week:   intensity (how strenuous is your workout)  frequency (how often you are exercising)  duration (how many minutes at a time you are exercising)  Walking for 20 minutes a  day is certainly better than nothing, but more strenuous exercise will develop better cardiovascular fitness.   interval training (high-intensity alternating with low-intensity, think walk/jog rather than just walk)  muscle strengthening exercises (weight lifting, calisthenics, yoga) - this also helps prevent osteoporosis!   Things to remember for diet changes for weight loss:   Please note - I am not a certified dietician. I can present you with ideas and general diet goals, but a meal plan is largely up to you. I am happy to refer you to a dietician who can give you a detailed meal plan.  Apps/logs are crucial to track how you're eating! It's not realistic to be logging everything you eat forever, but when you're starting a healthy eating lifestyle it's very helpful, and checking in with logs now and then helps you stick to your program!   Calorie restriction with the goal weight loss of no more than one to one and a half pounds per week.   Increase lean protein such as chicken, fish, [place].   Decrease  fatty foods such as dairy, butter.   Decrease sugary foods. Avoid sugary drinks such as soda or juice.  Increase fiber found in fruit and vegetables.   Medications approved for long-term use for obesity  Qsymia (Phentermine and Topiramate)  Saxenda (Liraglutide 3 mg/day)  Contrave (Bupropion and Naltrexone)  Lorcaserin (Belviq or Belviq XR)  Orlistat (Xenical, Alli)  Bupropion (Wellbutrin) I recommend that you research the above medications and see which one(s) your insurance may or may not cover: If you call your insurance, ask them specifically what medications are on their formulary that are approved for obesity treatment. They should be able to send you a list or tell you over the phone. Remember, medications aren't magic! You MUST be diligent about lifestyle changes as well!         Visit summary with medication list and pertinent instructions was printed for patient  to review. All questions at time of visit were answered - patient instructed to contact office with any additional concerns or updates. ER/RTC precautions were reviewed with the patient.     Please note: voice recognition software was used to produce this document, and typos may escape review. Please contact Dr. Sheppard Coil for any needed clarifications.     Follow-up plan: Return in about 1 year (around 10/06/2019) for [place] - sooner if needed. Marland Kitchen

## 2018-10-05 NOTE — Patient Instructions (Addendum)
General Preventive Care  Most recent routine screening lipids/other labs: ordered today   Everyone should have blood pressure checked once per year.   Tobacco: don't!   Alcohol: responsible moderation is ok for most adults - if you have concerns about your alcohol intake, please talk to me!   Exercise: as tolerated to reduce risk of cardiovascular disease and diabetes. Strength training will also prevent osteoporosis.   Mental health: if need for mental health care (medicines, counseling, other), or concerns about moods, please let me know!   Sexual health: if need for STD testing, or if concerns with libido/pain problems, please let me or OBGYN know!   Advanced Directive: Living Will and/or Healthcare Power of Attorney recommended for all adults, regardless of age or health.  Vaccines  Flu vaccine: recommended for almost everyone, every fall.   Shingles vaccine: Shingrix recommended after age 1.  Pneumonia vaccines: Prevnar and Pneumovax recommended after age 1, or sooner if certain medical conditions.  Tetanus booster: Tdap recommended every 10 years. Done 07/2016 Cancer screenings   Colon cancer screening: recommended for everyone at age 32. Consider sooner given family history polyps.   Breast cancer screening: mammogram recommended at age 65 every other year at least, and annually after age 74.   Cervical cancer screening: Pap every 1 to 5 years depending on age and other risk factors. Can usually stop at age 44 or w/ hysterectomy. Follow up as directed by OBGYN. Will request records.   Lung cancer screening: not needed for non-smokers  Infection screenings . HIV, Gonorrhea/Chlamydia: screening as needed . Hepatitis C: recommended for anyone born 84-1966 - not needed . TB: certain at-risk populations, or depending on work requirements and/or travel history Other . Bone Density Test: recommended for women at age 2            Weight loss: important things  to remember  It is hard work! You will have setbacks, but don't get discouraged. The goal is not short-term success, it is long-term health.   Looking at the numbers is important to track your progress and set goals, but how you are feeling and your overall health are the most important things! BMI and pounds and calories and miles logged aren't everything - they are tools to help [place] reach your goals.  You can do this!!!   Things to remember for exercise for weight loss:   Please note - I am not a certified personal trainer. I can present you with ideas and general workout goals, but an exercise program is largely up to you. Find something you can stick with, and something you enjoy!   As you progress in your exercise regimen think about gradually increasing the following, week by week:   intensity (how strenuous is your workout)  frequency (how often you are exercising)  duration (how many minutes at a time you are exercising)  Walking for 20 minutes a day is certainly better than nothing, but more strenuous exercise will develop better cardiovascular fitness.   interval training (high-intensity alternating with low-intensity, think walk/jog rather than just walk)  muscle strengthening exercises (weight lifting, calisthenics, yoga) - this also helps prevent osteoporosis!   Things to remember for diet changes for weight loss:   Please note - I am not a certified dietician. I can present you with ideas and general diet goals, but a meal plan is largely up to you. I am happy to refer you to a dietician who can give you a detailed  meal plan.  Apps/logs are crucial to track how you're eating! It's not realistic to be logging everything you eat forever, but when you're starting a healthy eating lifestyle it's very helpful, and checking in with logs now and then helps you stick to your program!   Calorie restriction with the goal weight loss of no more than one to one and a half pounds per  week.   Increase lean protein such as chicken, fish, [place].   Decrease fatty foods such as dairy, butter.   Decrease sugary foods. Avoid sugary drinks such as soda or juice.  Increase fiber found in fruit and vegetables.   Medications approved for long-term use for obesity  Qsymia (Phentermine and Topiramate)  Saxenda (Liraglutide 3 mg/day)  Contrave (Bupropion and Naltrexone)  Lorcaserin (Belviq or Belviq XR)  Orlistat (Xenical, Alli)  Bupropion (Wellbutrin) I recommend that you research the above medications and see which one(s) your insurance may or may not cover: If you call your insurance, ask them specifically what medications are on their formulary that are approved for obesity treatment. They should be able to send you a list or tell you over the phone. Remember, medications aren't magic! You MUST be diligent about lifestyle changes as well!

## 2018-10-06 LAB — HM MAMMOGRAPHY

## 2018-10-09 ENCOUNTER — Encounter: Payer: Self-pay | Admitting: Sports Medicine

## 2018-10-09 ENCOUNTER — Ambulatory Visit (INDEPENDENT_AMBULATORY_CARE_PROVIDER_SITE_OTHER): Payer: Managed Care, Other (non HMO) | Admitting: Sports Medicine

## 2018-10-09 DIAGNOSIS — M25561 Pain in right knee: Secondary | ICD-10-CM

## 2018-10-09 DIAGNOSIS — G8929 Other chronic pain: Secondary | ICD-10-CM | POA: Diagnosis not present

## 2018-10-09 MED ORDER — MELOXICAM 15 MG PO TABS: ORAL_TABLET | ORAL | 3 refills | Status: DC

## 2018-10-09 NOTE — Progress Notes (Signed)
Subjective:    I'm seeing this patient as a consultation for: Dr. Emeterio Reeve  CC: Right knee pain  HPI: For over a year this pleasant 48 year old female has had pain that she localizes in the anteromedial aspect of her right knee, occasional locking and catching, moderate, persistent without radiation.  She has tried some topical medications but has not done anything oral, no physical therapy, has never had injections into the knee.  I reviewed the past medical history, family history, social history, surgical history, and allergies today and no changes were needed.  Please see the problem list section below in epic for further details.  Past Medical History: Past Medical History:  Diagnosis Date  . Migraine headache 12/13/2017  . Perimenopausal 12/13/2017   Past Surgical History: Past Surgical History:  Procedure Laterality Date  . BUNIONECTOMY Right 2004  . CESAREAN SECTION    . TONSILLECTOMY    . TUBAL LIGATION     Social History: Social History   Socioeconomic History  . Marital status: Married    Spouse name: Not on file  . Number of children: Not on file  . Years of education: Not on file  . Highest education level: Not on file  Occupational History  . Not on file  Social Needs  . Financial resource strain: Not on file  . Food insecurity:    Worry: Not on file    Inability: Not on file  . Transportation needs:    Medical: Not on file    Non-medical: Not on file  Tobacco Use  . Smoking status: Never Smoker  . Smokeless tobacco: Never Used  Substance and Sexual Activity  . Alcohol use: No  . Drug use: Not on file  . Sexual activity: Not on file  Lifestyle  . Physical activity:    Days per week: Not on file    Minutes per session: Not on file  . Stress: Not on file  Relationships  . Social connections:    Talks on phone: Not on file    Gets together: Not on file    Attends religious service: Not on file    Active member of club or organization:  Not on file    Attends meetings of clubs or organizations: Not on file    Relationship status: Not on file  Other Topics Concern  . Not on file  Social History Narrative  . Not on file   Family History: Family History  Problem Relation Age of Onset  . Hypertension Mother   . Hyperlipidemia Mother   . Cancer Mother        Uterine Cancer  . Cancer Maternal Uncle        LUNG  . Cancer Paternal Uncle        SKIN  . Cancer Maternal Grandmother        BREAST  . Cancer Paternal Grandmother        BREAST  . Diabetes Paternal Grandmother    Allergies: Allergies  Allergen Reactions  . Doxycycline Rash   Medications: See med rec.  Review of Systems: No headache, visual changes, nausea, vomiting, diarrhea, constipation, dizziness, abdominal pain, skin rash, fevers, chills, night sweats, weight loss, swollen lymph nodes, body aches, joint swelling, muscle aches, chest pain, shortness of breath, mood changes, visual or auditory hallucinations.   Objective:   General: Well Developed, well nourished, and in no acute distress.  Neuro:  Extra-ocular muscles intact, able to move all 4 extremities, sensation grossly intact.  Deep tendon reflexes tested were normal. Psych: Alert and oriented, mood congruent with affect. ENT:  Ears and nose appear unremarkable.  Hearing grossly normal. Neck: Unremarkable overall appearance, trachea midline.  No visible thyroid enlargement. Eyes: Conjunctivae and lids appear unremarkable.  Pupils equal and round. Skin: Warm and dry, no rashes noted.  Cardiovascular: Pulses palpable, no extremity edema. Right knee: Normal to inspection with no erythema or effusion or obvious bony abnormalities. Tenderness at the anteromedial joint line ROM normal in flexion and extension and lower leg rotation. Ligaments with solid consistent endpoints including ACL, PCL, LCL, MCL. Negative Mcmurray's and provocative meniscal tests. Non painful patellar  compression. Patellar and quadriceps tendons unremarkable. Hamstring and quadriceps strength is normal.  Impression and Recommendations:   This case required medical decision making of moderate complexity.  Right knee pain Suspect anteromedial meniscal injury. X-rays were for the most part negative. She will actually take her meloxicam this time, adding meniscal rehab exercises. Return to see me in 4 to 6 weeks, MRI/injection if no better. ___________________________________________ Gwen Her. Dianah Field, M.D., ABFM., CAQSM. Primary Care and Sports Medicine [facility]  Adjunct Professor of [employer]

## 2018-10-09 NOTE — Assessment & Plan Note (Signed)
Suspect anteromedial meniscal injury. X-rays were for the most part negative. She will actually take her meloxicam this time, adding meniscal rehab exercises. Return to see me in 4 to 6 weeks, MRI/injection if no better.

## 2018-10-19 ENCOUNTER — Telehealth: Payer: Self-pay | Admitting: Osteopathic Medicine

## 2018-10-19 NOTE — Telephone Encounter (Signed)
Please call patient: I received her Pap smear results from November 2019.  Just wanted to make sure she was aware that there was a mild abnormality, atypical cells but negative HPV testing.  The recommendation for this is to repeat Pap in no later than 3 years.  Please make sure patient is aware and that she has follow-up in place.  See scanned documents for Pap report.

## 2018-10-19 NOTE — Telephone Encounter (Signed)
Pt has been updated of provider's note. Was not aware of past abnormal PAP results. As per pt, she has yearly checks regardless of current guidelines.

## 2018-10-26 ENCOUNTER — Encounter: Payer: Self-pay | Admitting: Osteopathic Medicine

## 2018-10-26 LAB — HM MAMMOGRAPHY

## 2018-11-06 ENCOUNTER — Ambulatory Visit: Payer: Managed Care, Other (non HMO) | Admitting: Sports Medicine

## 2018-11-23 ENCOUNTER — Ambulatory Visit (INDEPENDENT_AMBULATORY_CARE_PROVIDER_SITE_OTHER): Payer: Managed Care, Other (non HMO) | Admitting: Osteopathic Medicine

## 2018-11-23 ENCOUNTER — Other Ambulatory Visit: Payer: Self-pay

## 2018-11-23 ENCOUNTER — Encounter: Payer: Self-pay | Admitting: Osteopathic Medicine

## 2018-11-23 VITALS — BP 145/78 | HR 76 | Temp 98.1°F | Wt 175.4 lb

## 2018-11-23 DIAGNOSIS — K648 Other hemorrhoids: Secondary | ICD-10-CM

## 2018-11-23 NOTE — Patient Instructions (Signed)

## 2018-11-23 NOTE — Progress Notes (Signed)
HPI: Diane Warren is a 48 y.o. female who  has a past medical history of Migraine headache (12/13/2017) and Perimenopausal (12/13/2017).  she presents to [facility] today, 11/23/18,  for chief complaint of:  Blood in stool  Bright red blood per rectum, no pain, no known hemorrhoid history, worried given recent breast cancer diagnosis just wants to get checked out. Has been overall having normal BM but was constipated this am. Has photo of stool/blood: no black stool, spots of blood in toilet/on paper but no saturation of the water w/ red    At today's visit 11/23/18 ... PMH, PSH, FH reviewed and updated as needed.  Current medication list and allergy/intolerance hx reviewed and updated as needed. (See remainder of HPI, ROS, Phys Exam below)          ASSESSMENT/PLAN: The encounter diagnosis was Internal hemorrhoid.   No external hemorrhoid noted, no mass, no active bleeding. I think watchful waiting ok, let me know if pain or more sever bleeding.   See pt printed instructions re: hemorrhoids.    Follow-up plan: Return if symptoms worsen or fail to improve.                                                 ################################################# ################################################# ################################################# #################################################    Current Meds  Medication Sig  . Drospirenone-Ethinyl Estradiol-Levomefol (BEYAZ) 3-0.02-0.451 MG tablet   . fluticasone (FLONASE) 50 MCG/ACT nasal spray Place 1-2 sprays into both nostrils daily.  Marland Kitchen ipratropium (ATROVENT) 0.06 % nasal spray 2 SPRAYS EACH NOSTRIL 4 TIMES A DAY  . meloxicam (MOBIC) 15 MG tablet One tab PO qAM with breakfast for 2 weeks, then daily prn pain.  . promethazine (PHENERGAN) 25 MG tablet Take 0.5-1 tablets (12.5-25 mg total) by mouth every 4 (four) hours as needed for  nausea or vomiting.  . SUMAtriptan (IMITREX) 50 MG tablet Take 0.5-1 tablets (25-50 mg total) by mouth daily as needed for migraine (Can repeat dose in 2 hours if headaches persist).    Allergies  Allergen Reactions  . Doxycycline Rash and Hives       Review of Systems:  Constitutional: No recent illness  Cardiac: No  chest pain  Respiratory:  No  shortness of breath. No  Cough  Gastrointestinal: No  abdominal pain, see HPI  Musculoskeletal: No new myalgia/arthralgia  Skin: No  Rash  Psychiatric: No  concerns with depression, +concerns with anxiety  Exam:  BP (!) 145/78 (BP Location: Left Arm, Patient Position: Sitting, Cuff Size: Normal)   Pulse 76   Temp 98.1 F (36.7 C) (Oral)   Wt 175 lb 6.4 oz (79.6 kg)   BMI 34.26 kg/m   Constitutional: VS see above. General Appearance: alert, well-developed, well-nourished, NAD  Neck: No masses, trachea midline.   Respiratory: Normal respiratory effort.   Musculoskeletal: Gait normal. Symmetric and independent movement of all extremities  Abdominal: rectal exam normal tone and no masses  Neurological: Normal balance/coordination. No tremor.  Skin: warm, dry, intact.   Psychiatric: Normal judgment/insight. Normal mood and affect. Oriented x3.       Visit summary with medication list and pertinent instructions was printed for patient to review, patient was advised to alert [place] if any updates are needed. All questions at time of visit were answered - patient instructed to contact office with any  additional concerns. ER/RTC precautions were reviewed with the patient and understanding verbalized.      Please note: voice recognition software was used to produce this document, and typos may escape review. Please contact Dr. Sheppard Coil for any needed clarifications.    Follow up plan: Return if symptoms worsen or fail to improve.

## 2018-12-05 DIAGNOSIS — C50919 Malignant neoplasm of unspecified site of unspecified female breast: Secondary | ICD-10-CM | POA: Insufficient documentation

## 2018-12-28 ENCOUNTER — Encounter: Payer: Self-pay | Admitting: Osteopathic Medicine

## 2019-03-09 DIAGNOSIS — A6004 Herpesviral vulvovaginitis: Secondary | ICD-10-CM | POA: Insufficient documentation

## 2019-05-15 ENCOUNTER — Telehealth: Payer: Self-pay | Admitting: Osteopathic Medicine

## 2019-05-15 NOTE — Telephone Encounter (Signed)
Patient calling in stating that she just finished chemo and that her oncologist told her to come back to see her PCP. Patient state that she is having a lot of pain in her hip all the way through her leg. Sates that she would like to come in tomorrow after 3pm. Nothing open. She would like to see what Dr.Alexander recommends. Please advise.

## 2019-05-15 NOTE — Telephone Encounter (Signed)
I do not have any openings, perhaps she could see sports medicine, they should have time slots tomorrow afternoon?

## 2019-05-16 ENCOUNTER — Ambulatory Visit (INDEPENDENT_AMBULATORY_CARE_PROVIDER_SITE_OTHER): Payer: Managed Care, Other (non HMO)

## 2019-05-16 ENCOUNTER — Other Ambulatory Visit: Payer: Self-pay

## 2019-05-16 ENCOUNTER — Ambulatory Visit (INDEPENDENT_AMBULATORY_CARE_PROVIDER_SITE_OTHER): Payer: Managed Care, Other (non HMO) | Admitting: Family Medicine

## 2019-05-16 ENCOUNTER — Encounter: Payer: Self-pay | Admitting: Family Medicine

## 2019-05-16 VITALS — BP 158/89 | HR 77 | Temp 98.3°F | Wt 182.0 lb

## 2019-05-16 DIAGNOSIS — M79604 Pain in right leg: Secondary | ICD-10-CM

## 2019-05-16 MED ORDER — DICLOFENAC SODIUM 1 % TD GEL: 4.0000 g | Freq: Four times a day (QID) | TRANSDERMAL | 11 refills | Status: DC

## 2019-05-16 NOTE — Progress Notes (Signed)
Diane Warren is a 48 y.o. female who presents to [facility] today for right leg pain.  Patient has a significant past medical history of breast cancer.  She had mastectomy in March 2020 and finished her chemotherapy August 7.  She is on estrogen suppressing therapy currently..  She notes that she has been experiencing pain in her right leg for the last few months.  She has pain at the anterior aspect of her hip and in the medial knee and medial ankle.  She notes this occurs with activity and improves a bit with rest.  She denies any bowel bladder dysfunction weakness or numbness distally.  I saw her in 2017 for hip pain and she had significant improvement with femoral acetabular injection as well as home exercise program.  Her pain returned recently.  She denies any injury.  Her oncologist ordered a knee x-ray which was normal in early August. ROS:  As above  Exam:  BP (!) 158/89   Pulse 77   Temp 98.3 F (36.8 C) (Oral)   Wt 182 lb (82.6 kg)   BMI 35.54 kg/m  Wt Readings from Last 5 Encounters:  05/16/19 182 lb (82.6 kg)  11/23/18 175 lb 6.4 oz (79.6 kg)  10/09/18 174 lb (78.9 kg)  10/05/18 174 lb 12.8 oz (79.3 kg)  06/19/18 174 lb 4.8 oz (79.1 kg)   General: Well Developed, well nourished, and in no acute distress.  Neuro/Psych: Alert and oriented x3, extra-ocular muscles intact, able to move all 4 extremities, sensation grossly intact. Skin: Warm and dry, no rashes noted.  Respiratory: Not using accessory muscles, speaking in full sentences, trachea midline.  Cardiovascular: Pulses palpable, no extremity edema. Abdomen: Does not appear distended. MSK:  L-spine nontender to spinal midline normal lumbar motion. Right hip normal-appearing Pain with internal rotation and flexion present at anterior aspect of hip. Not particularly tender. Slightly reduced hip abduction strength.  Right knee: Normal-appearing without significant  effusion. Range of motion 0-120 degrees. Tender palpation anterior aspect of medial joint line just medial to patellar tendon. Intact strength.  Stable he was exam however guarding present with exam testing.  Mild antalgic gait present.    Lab and Radiology Results  X-ray images L-spine and right hip obtained today personally independently reviewed  Right hip: No fractures lytic lesion or avascular necrosis present.  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  L-spine: No lytic lesions or fractures visible.  Mild degenerative changes present.  Await formal radiology review  Interface, Rad Results In - 04/13/2019  1:08 PM EDT CLINICAL DATA:  Pain and edema. History of bursitis and breast cancer. Popping noises with walking.  EXAM: RIGHT KNEE - 3 VIEW  COMPARISON:  None.  FINDINGS: No evidence of fracture, dislocation, or joint effusion. No evidence of arthropathy or other focal bone abnormality. Soft tissues are unremarkable.  IMPRESSION: Negative.   Electronically Signed   By: Dorise Bullion III M.D   On: 04/13/2019 13:06  Assessment and Plan: 48 y.o. female with  Right hip and knee pain.  I believe the pain to be 2 discrete issues.  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  Plan for trial of some relative rest and diclofenac gel.  Recheck in 2 weeks.  Likely next up will be ultrasound-guided femoral acetabular injection.  As for knee pain this may  be degenerative in nature or secondary to her underlying hip issue.  Doubtful for lumbar radicular pain.  Again  plan for diclofenac gel likely proceed with steroid injection if not improving.  Recheck 2 weeks. Await formal radiology over read of x-rays as above.  Important to rule out lytic lesions before proceeding with further treatment.  CC: Emeterio Reeve, DO CC:  Oncology: Rea [employer], MD  [address]  [place], [place] [address]  [phone]  [phone] (Fax)   PDMP not reviewed this encounter. Orders Placed This Encounter  Procedures  . DG HIP UNILAT WITH PELVIS 2-3 VIEWS RIGHT    Standing Status:   Future    Number of Occurrences:   1    Standing Expiration Date:   07/15/2020    Order Specific Question:   Reason for Exam (SYMPTOM  OR DIAGNOSIS REQUIRED)    Answer:   eval right hip pain. recent history breast cancer    Order Specific Question:   Is patient pregnant?    Answer:   No    Order Specific Question:   Preferred imaging location?    Answer:   [facility]    Order Specific Question:   Radiology Contrast Protocol - do NOT remove file path    Answer:   \\[email or web address].[email or web address]  . DG Lumbar Spine Complete    Standing Status:   Future    Number of Occurrences:   1    Standing Expiration Date:   07/15/2020    Order Specific Question:   Reason for Exam (SYMPTOM  OR DIAGNOSIS REQUIRED)    Answer:   eval lspine possible right radiculopathy at L4. Recent history of breast cancer    Order Specific Question:   Is patient pregnant?    Answer:   No    Order Specific Question:   Preferred imaging location?    Answer:   [facility]    Order Specific Question:   Radiology Contrast Protocol - do NOT remove file path    Answer:   \\[email or web address]s.[email or web address]   Meds ordered this encounter  Medications  . diclofenac sodium (VOLTAREN) 1 % GEL    Sig: Apply 4 g topically 4 (four) times daily. To affected joint.    Dispense:  100 g    Refill:  11    Historical information moved to improve visibility of documentation.  Past Medical History:  Diagnosis Date  . Migraine headache 12/13/2017  . Perimenopausal 12/13/2017   Past Surgical History:  Procedure Laterality Date  . BUNIONECTOMY Right 2004  . CESAREAN SECTION    . TONSILLECTOMY    .  TUBAL LIGATION     Social History   Tobacco Use  . Smoking status: Never Smoker  . Smokeless tobacco: Never Used  Substance Use Topics  . Alcohol use: No   family history includes Cancer in her maternal grandmother, maternal uncle, mother, paternal grandmother, and paternal uncle; Diabetes in her paternal grandmother; Hyperlipidemia in her mother; Hypertension in her mother.  Medications: Current Outpatient Medications  Medication Sig Dispense Refill  . fluticasone (FLONASE) 50 MCG/ACT nasal spray Place 1-2 sprays into both nostrils daily. 48 g 3  . ipratropium (ATROVENT) 0.06 % nasal spray 2 SPRAYS EACH NOSTRIL 4 TIMES A DAY 10 mL 1  . diclofenac sodium (VOLTAREN) 1 % GEL Apply 4 g topically 4 (four) times daily. To affected joint. 100 g 11   No current facility-administered medications for this visit.    Allergies  Allergen Reactions  .  Doxycycline Rash and Hives      Discussed warning signs or symptoms. Please see discharge instructions. Patient expresses understanding.

## 2019-05-16 NOTE — Patient Instructions (Signed)
Thank you for coming in today. Take voltaren gel for pain as needed.  Get xray.  Recheck in 2 weeks if not better. Likely will do injections then.

## 2019-05-16 NOTE — Telephone Encounter (Signed)
Patient scheduled with Dr Georgina Snell

## 2019-05-17 ENCOUNTER — Encounter: Payer: Self-pay | Admitting: Family Medicine

## 2019-05-30 ENCOUNTER — Ambulatory Visit: Payer: Managed Care, Other (non HMO) | Admitting: Family Medicine

## 2019-05-30 NOTE — Progress Notes (Deleted)
   Diane Warren is a 48 y.o. female who presents to [place] today for ***  Follow-up right leg pain.  Patient was seen September 9 for right leg pain thought to be femoral acetabular hip pain, and knee pain.  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  ***  ROS:  As above  Exam:  There were no vitals taken for this visit. Wt Readings from Last 5 Encounters:  05/16/19 182 lb (82.6 kg)  11/23/18 175 lb 6.4 oz (79.6 kg)  10/09/18 174 lb (78.9 kg)  10/05/18 174 lb 12.8 oz (79.3 kg)  06/19/18 174 lb 4.8 oz (79.1 kg)   General: Well Developed, well nourished, and in no acute distress.  Neuro/Psych: Alert and oriented x3, extra-ocular muscles intact, able to move all 4 extremities, sensation grossly intact. Skin: Warm and dry, no rashes noted.  Respiratory: Not using accessory muscles, speaking in full sentences, trachea midline.  Cardiovascular: Pulses palpable, no extremity edema. Abdomen: Does not appear distended. MSK: ***    Lab and Radiology Results No results found for this or any previous visit (from the past 72 hour(s)). No results found.     Assessment and Plan: 48 y.o. female with ***   PDMP not reviewed this encounter. No orders of the defined types were placed in this encounter.  No orders of the defined types were placed in this encounter.   Historical information moved to improve visibility of documentation.  Past Medical History:  Diagnosis Date  . Migraine headache 12/13/2017  . Perimenopausal 12/13/2017   Past Surgical History:  Procedure Laterality Date  . BUNIONECTOMY Right 2004  . CESAREAN SECTION    . TONSILLECTOMY    . TUBAL LIGATION     Social History   Tobacco Use  . Smoking status: Never Smoker  . Smokeless tobacco: Never Used  Substance Use Topics  . Alcohol use: No   family history includes Cancer  in her maternal grandmother, maternal uncle, mother, paternal grandmother, and paternal uncle; Diabetes in her paternal grandmother; Hyperlipidemia in her mother; Hypertension in her mother.  Medications: Current Outpatient Medications  Medication Sig Dispense Refill  . diclofenac sodium (VOLTAREN) 1 % GEL Apply 4 g topically 4 (four) times daily. To affected joint. 100 g 11  . fluticasone (FLONASE) 50 MCG/ACT nasal spray Place 1-2 sprays into both nostrils daily. 48 g 3  . ipratropium (ATROVENT) 0.06 % nasal spray 2 SPRAYS EACH NOSTRIL 4 TIMES A DAY 10 mL 1   No current facility-administered medications for this visit.    Allergies  Allergen Reactions  . Doxycycline Rash and Hives      Discussed warning signs or symptoms. Please see discharge instructions. Patient expresses understanding.

## 2019-08-15 LAB — HM PAP SMEAR: HM Pap smear: ABNORMAL

## 2019-08-26 ENCOUNTER — Other Ambulatory Visit: Payer: Self-pay | Admitting: Osteopathic Medicine

## 2019-09-08 ENCOUNTER — Emergency Department (INDEPENDENT_AMBULATORY_CARE_PROVIDER_SITE_OTHER)
Admission: EM | Admit: 2019-09-08 | Discharge: 2019-09-08 | Disposition: A | Discharge status: Home / Self Care | Payer: Managed Care, Other (non HMO) | Source: Home / Self Care

## 2019-09-08 ENCOUNTER — Other Ambulatory Visit: Payer: Self-pay

## 2019-09-08 ENCOUNTER — Encounter: Payer: Self-pay | Admitting: Emergency Medicine

## 2019-09-08 DIAGNOSIS — R21 Rash and other nonspecific skin eruption: Secondary | ICD-10-CM | POA: Diagnosis not present

## 2019-09-08 HISTORY — DX: Malignant (primary) neoplasm, unspecified: C80.1

## 2019-09-08 MED ORDER — TRIAMCINOLONE ACETONIDE 0.1 % EX CREA: TOPICAL_CREAM | CUTANEOUS | 0 refills | Status: AC

## 2019-09-08 NOTE — ED Triage Notes (Signed)
Patient currently undergoing chemotherapy for breast cancer. On 12/24 she noticed 2 spots on left hand; used cortisone cream and it cleared up. Three days ago she noticed fine rash on both hands and forearms, with one spot on right forearm forming blister. She has been taking benadryl for itching but it has not helped.  She has had the influenza vacc this season. She has not been exposed to covid positive person to her knowldedge.

## 2019-09-08 NOTE — ED Provider Notes (Signed)
[name]    CSN: [id number] Arrival date & time: 09/08/19  [id number]      History   Chief Complaint Chief Complaint  Patient presents with  . Rash    HPI Diane Warren is a 49 y.o. female.   Patient is currently undergoing chemotherapy for breast cancer.  On 08/30/19 she noticed two macular spots on her left hand that cleared with 1% hydrocortisone cream.  Three days ago she noted a pruritic faint rash only on her forearms that has not improved with Benadryl.  The history is provided by the patient.  Rash Location: forearms. Quality: dryness, itchiness and redness   Quality: not blistering, not bruising, not burning, not draining, not painful, not peeling, not scaling, not swelling and not weeping   Severity:  Mild Onset quality:  Sudden Duration:  3 days Timing:  Constant Progression:  Unchanged Chronicity:  New Context: medications   Context: not animal contact, not chemical exposure, not exposure to similar rash, not food, not hot tub use, not insect bite/sting, not new detergent/soap, not nuts and not plant contact   Relieved by:  Nothing Worsened by:  Nothing Ineffective treatments:  Topical steroids and antihistamines Associated symptoms: no abdominal pain, no diarrhea, no fatigue, no fever, no induration, no joint pain, no myalgias, no nausea and no sore throat     Past Medical History:  Diagnosis Date  . Cancer ([place])   . Migraine headache 12/13/2017  . Perimenopausal 12/13/2017    Patient Active Problem List   Diagnosis Date Noted  . Migraine headache 12/13/2017  . Borderline blood pressure 12/13/2017  . Perimenopausal 12/13/2017  . Elevated BP without diagnosis of hypertension 07/13/2017  . Strain of hip adductor muscle, initial encounter 07/22/2016  . Encounter for annual physical exam 05/21/2016  . Obesity 12/31/2015  . Right knee pain 09/25/2015  . Right hip pain 09/25/2015  . BMI 32.0-32.9,adult 09/25/2015    Past Surgical History:    Procedure Laterality Date  . BREAST SURGERY    . BUNIONECTOMY Right 2004  . CESAREAN SECTION    . TONSILLECTOMY    . TUBAL LIGATION      OB History   No obstetric history on file.      Home Medications    Prior to Admission medications   Medication Sig Start Date End Date Taking? Authorizing Provider  diclofenac sodium (VOLTAREN) 1 % GEL Apply 4 g topically 4 (four) times daily. To affected joint. 05/16/19   Gregor Hams, MD  fluticasone (FLONASE) 50 MCG/ACT nasal spray PLACE 1-2 SPRAYS INTO BOTH NOSTRILS DAILY. 08/27/19   Emeterio Reeve, DO  ipratropium (ATROVENT) 0.06 % nasal spray 2 SPRAYS EACH NOSTRIL 4 TIMES A DAY 07/26/18   Emeterio Reeve, DO  triamcinolone cream (KENALOG) 0.1 % Apply thin layer to affected areas BID 09/08/19   Kandra Nicolas, MD    Family History Family History  Problem Relation Age of Onset  . Hypertension Mother   . Hyperlipidemia Mother   . Cancer Mother        Uterine Cancer  . Cancer Maternal Uncle        LUNG  . Cancer Paternal Uncle        SKIN  . Cancer Maternal Grandmother        BREAST  . Cancer Paternal Grandmother        BREAST  . Diabetes Paternal Grandmother     Social History Social History   Tobacco Use  . Smoking status:  Never Smoker  . Smokeless tobacco: Never Used  Substance Use Topics  . Alcohol use: No  . Drug use: Not on file     Allergies   Doxycycline   Review of Systems Review of Systems  Constitutional: Negative for fatigue and fever.  HENT: Negative for sore throat.   Gastrointestinal: Negative for abdominal pain, diarrhea and nausea.  Musculoskeletal: Negative for arthralgias and myalgias.  Skin: Positive for rash.  All other systems reviewed and are negative.    Physical Exam Triage Vital Signs ED Triage Vitals  Enc Vitals Group     BP 09/08/19 1156 (!) 166/91     Pulse Rate 09/08/19 1156 75     Resp 09/08/19 1156 16     Temp 09/08/19 1156 98.3 F (36.8 C)     Temp Source  09/08/19 1156 Oral     SpO2 09/08/19 1156 100 %     Weight 09/08/19 1157 166 lb (75.3 kg)     Height 09/08/19 1157 5' (1.524 m)     Head Circumference --      Peak Flow --      Pain Score 09/08/19 1157 0     Pain Loc --      Pain Edu? --      Excl. in [place]? --    No data found.  Updated Vital Signs BP (!) 166/91 (BP Location: Left Arm)   Pulse 75   Temp 98.3 F (36.8 C) (Oral)   Resp 16   Ht 5' (1.524 m)   Wt 75.3 kg   SpO2 100%   BMI 32.42 kg/m   Visual Acuity Right Eye Distance:   Left Eye Distance:   Bilateral Distance:    Right Eye Near:   Left Eye Near:    Bilateral Near:     Physical Exam Vitals and nursing note reviewed.  Constitutional:      General: She is not in acute distress. HENT:     Head: Normocephalic.     Mouth/Throat:     Pharynx: Oropharynx is clear.  Eyes:     Pupils: Pupils are equal, round, and reactive to light.  Cardiovascular:     Rate and Rhythm: Normal rate.  Pulmonary:     Effort: Pulmonary effort is normal.  Musculoskeletal:       Arms:     Cervical back: Neck supple.     Right lower leg: No edema.     Left lower leg: No edema.     Comments: Both forearms have a faint erythematous eruption of tiny punctate erythematous macules.  No swelling, tenderness, or warmth.  Lymphadenopathy:     Cervical: No cervical adenopathy.  Skin:    General: Skin is warm and dry.  Neurological:     Mental Status: She is alert.      UC Treatments / Results  Labs (all labs ordered are listed, but only abnormal results are displayed) Labs Reviewed - No data to display  EKG   Radiology No results found.  Procedures Procedures (including critical care time)  Medications Ordered in UC Medications - No data to display  Initial Impression / Assessment and Plan / UC Course  I have reviewed the triage vital signs and the nursing notes.  Pertinent labs & imaging results that were available during my care of the patient were reviewed by  me and considered in my medical decision making (see chart for details).    ?allergic reaction.  Begin triamcinolone 0.1% cream BID  Followup with dermatologist if not improved one week.   Final Clinical Impressions(s) / UC Diagnoses   Final diagnoses:  Rash and nonspecific skin eruption     Discharge Instructions     May take Benadryl if needed for itching at night.    ED Prescriptions    Medication Sig Dispense Auth. Provider   triamcinolone cream (KENALOG) 0.1 % Apply thin layer to affected areas BID 30 g Kandra Nicolas, MD        Kandra Nicolas, MD 09/15/19 [phone]

## 2019-09-08 NOTE — Discharge Instructions (Addendum)
May take Benadryl if needed for itching at night.

## 2019-09-14 ENCOUNTER — Encounter: Payer: Self-pay | Admitting: Osteopathic Medicine

## 2019-09-26 ENCOUNTER — Encounter: Payer: Self-pay | Admitting: Osteopathic Medicine

## 2019-09-26 ENCOUNTER — Other Ambulatory Visit: Payer: Self-pay

## 2019-09-26 ENCOUNTER — Ambulatory Visit (INDEPENDENT_AMBULATORY_CARE_PROVIDER_SITE_OTHER): Payer: Managed Care, Other (non HMO) | Admitting: Osteopathic Medicine

## 2019-09-26 VITALS — BP 133/86 | HR 76 | Temp 98.1°F | Wt 168.1 lb

## 2019-09-26 DIAGNOSIS — C50919 Malignant neoplasm of unspecified site of unspecified female breast: Secondary | ICD-10-CM | POA: Diagnosis not present

## 2019-09-26 DIAGNOSIS — Z0000 Encounter for general adult medical examination without abnormal findings: Secondary | ICD-10-CM

## 2019-09-26 DIAGNOSIS — N951 Menopausal and female climacteric states: Secondary | ICD-10-CM

## 2019-09-26 DIAGNOSIS — Z1211 Encounter for screening for malignant neoplasm of colon: Secondary | ICD-10-CM

## 2019-09-26 DIAGNOSIS — R030 Elevated blood-pressure reading, without diagnosis of hypertension: Secondary | ICD-10-CM

## 2019-09-26 DIAGNOSIS — Z170 Estrogen receptor positive status [ER+]: Secondary | ICD-10-CM

## 2019-09-26 NOTE — Progress Notes (Signed)
HPI: Diane Warren is a 49 y.o. female who  has a past medical history of Cancer ([place]), Migraine headache (12/13/2017), and Perimenopausal (12/13/2017).  she presents to [facility] today, 09/26/19,  for chief complaint of: Annual physical     Patient here for annual physical / wellness exam.  See preventive care reviewed as below.   Additional concerns today include:  Nothing major. She's recovering well from breast cancer treatment (R mastectomy, chemo, planning for reconstruction).    Past medical, surgical, social and family history reviewed:  Patient Active Problem List   Diagnosis Date Noted  . Migraine headache 12/13/2017  . Borderline blood pressure 12/13/2017  . Perimenopausal 12/13/2017  . Elevated BP without diagnosis of hypertension 07/13/2017  . Strain of hip adductor muscle, initial encounter 07/22/2016  . Encounter for annual physical exam 05/21/2016  . Obesity 12/31/2015  . Right knee pain 09/25/2015  . Right hip pain 09/25/2015  . BMI 32.0-32.9,adult 09/25/2015    Past Surgical History:  Procedure Laterality Date  . BREAST SURGERY    . BUNIONECTOMY Right 2004  . CESAREAN SECTION    . TONSILLECTOMY    . TUBAL LIGATION      Social History   Tobacco Use  . Smoking status: Never Smoker  . Smokeless tobacco: Never Used  Substance Use Topics  . Alcohol use: No    Family History  Problem Relation Age of Onset  . Hypertension Mother   . Hyperlipidemia Mother   . Cancer Mother        Uterine Cancer  . Cancer Maternal Uncle        LUNG  . Cancer Paternal Uncle        SKIN  . Cancer Maternal Grandmother        BREAST  . Cancer Paternal Grandmother        BREAST  . Diabetes Paternal Grandmother      Current medication list and allergy/intolerance information reviewed:    Current Outpatient Medications  Medication Sig Dispense Refill  . anastrozole (ARIMIDEX) 1 MG tablet Take by mouth.    . Calcium Carbonate  (CALTRATE 600 PO) Take by mouth.    . diclofenac sodium (VOLTAREN) 1 % GEL Apply 4 g topically 4 (four) times daily. To affected joint. 100 g 11  . fluocinonide cream (LIDEX) 0.05 % APPLY A SMALL AMOUNT TO AFFECTED AREA TWICE A DAY FOR 2 WEEKS ON THEN 2 WEEKS OFF    . fluticasone (FLONASE) 50 MCG/ACT nasal spray PLACE 1-2 SPRAYS INTO BOTH NOSTRILS DAILY. 48 mL 3  . loratadine (CLARITIN) 10 MG tablet Take by mouth.    . tacrolimus (PROTOPIC) 0.1 % ointment APPLY AS DIRECTED TWICE A DAY USE ON THE OFF WEEKS    . triamcinolone cream (KENALOG) 0.1 % Apply thin layer to affected areas BID 30 g 0  . ipratropium (ATROVENT) 0.06 % nasal spray 2 SPRAYS EACH NOSTRIL 4 TIMES A DAY (Patient not taking: Reported on 09/26/2019) 10 mL 1   No current facility-administered medications for this visit.    Allergies  Allergen Reactions  . Doxycycline Rash and Hives      Review of Systems:  Constitutional:  No  fever, no chills, No recent illness, No unintentional weight changes. No significant fatigue.   HEENT: No  headache, no vision chan  Cardiac: No  chest pain, No  pressure, No palpitations  Respiratory:  No  shortness of breath. No  Cough  Gastrointestinal: No  abdominal  pain, No  nausea, No  vomiting,  No  blood in stool, No  diarrhea, No  constipation   Musculoskeletal: No new myalgia/arthralgia  Skin: +Rash getting better   Hem/Onc: No  easy bruising/bleeding  Neurologic: No  weakness, No  dizziness  Psychiatric: No  concerns with depression, No  concerns with anxiety, No sleep problems, No mood problems  Exam:  BP 133/86 (BP Location: Left Arm, Patient Position: Sitting, Cuff Size: Normal)   Pulse 76   Temp 98.1 F (36.7 C) (Oral)   Wt 168 lb 1.9 oz (76.3 kg)   BMI 32.83 kg/m   Constitutional: VS see above. General Appearance: alert, well-developed, well-nourished, NAD  Eyes: Normal lids and conjunctive, non-icteric sclera  Neck: No masses, trachea midline. No thyroid  enlargement. No tenderness/mass appreciated. No lymphadenopathy  Respiratory: Normal respiratory effort. no wheeze, no rhonchi, no rales  Cardiovascular: S1/S2 normal, no murmur, no rub/gallop auscultated. RRR. No lower extremity edema.   Gastrointestinal: Nontender, no masses. No hepatomegaly, no splenomegaly. No hernia appreciated. Bowel sounds normal. Rectal exam deferred.   Musculoskeletal: Gait normal. No clubbing/cyanosis of digits.   Neurological: Normal balance/coordination. No tremor. No cranial nerve deficit on limited exam. Motor and sensation intact and symmetric. Cerebellar reflexes intact.   Skin: warm, dry, intact. No rash/ulcer. No concerning nevi or subq nodules on limited exam.    Psychiatric: Normal judgment/insight. Normal mood and affect. Oriented x3.          ASSESSMENT/PLAN: The primary encounter diagnosis was Annual physical exam. 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   Orders Placed This Encounter  Procedures  . CBC  . COMPLETE METABOLIC PANEL WITH GFR  . Lipid panel  . Ambulatory referral to Gastroenterology    No orders of the defined types were placed in this encounter.   Patient Instructions  General Preventive Care  Most recent routine screening lipids/other labs: ordered today .   Everyone should have blood pressure checked once per year.   Tobacco: don't!   Alcohol: responsible moderation is ok for most adults - if you have concerns about your alcohol intake, please talk to me!   Exercise: as tolerated to reduce risk of cardiovascular disease and diabetes. Strength training will also prevent osteoporosis.   Mental health: if need for mental health care (medicines, counseling, other), or concerns about moods, please let me know!    Sexual health: if need for STD testing, or if concerns with libido/pain problems, please let me know!   Advanced Directive: Living Will and/or Healthcare Power of Attorney recommended for all adults, regardless of age or health.  Vaccines  Flu vaccine: recommended for almost everyone, every fall.   Shingles vaccine: Shingrix recommended after age 36.   Pneumonia vaccines: Prevnar and Pneumovax recommended after age 33, or sooner if certain medical conditions.  Tetanus booster: Tdap recommended every 10 years. Due 2027 Cancer screenings   Colon cancer screening: recommended for everyone at age 54-50  Breast cancer screening: surveillance per your oncology team!   Cervical cancer screening: Pap per OBGYN since last one had some abnormal cells though no cancer or precancer.   Lung cancer screening: not needed for non-smokers  Infection screenings . HIV: recommended screening at least once age 70-65, more often as needed. . Gonorrhea/Chlamydia: screening as needed . Hepatitis C: recommended for anyone born 83-1966 .  TB: certain at-risk populations, or depending on work requirements and/or travel history Other . Bone Density Test: recommended for women at age 7         Visit summary with medication list and pertinent instructions was printed for patient to review. All questions at time of visit were answered - patient instructed to contact office with any additional concerns or updates. ER/RTC precautions were reviewed with the patient.     Please note: voice recognition software was used to produce this document, and typos may escape review. Please contact Dr. Sheppard Coil for any needed clarifications.     Follow-up plan: Return in about 1 year (around 09/25/2020) for [place] (call week prior to visit for lab orders).

## 2019-09-26 NOTE — Patient Instructions (Signed)
General Preventive Care  Most recent routine screening lipids/other labs: ordered today .   Everyone should have blood pressure checked once per year.   Tobacco: don't!   Alcohol: responsible moderation is ok for most adults - if you have concerns about your alcohol intake, please talk to me!   Exercise: as tolerated to reduce risk of cardiovascular disease and diabetes. Strength training will also prevent osteoporosis.   Mental health: if need for mental health care (medicines, counseling, other), or concerns about moods, please let me know!   Sexual health: if need for STD testing, or if concerns with libido/pain problems, please let me know!   Advanced Directive: Living Will and/or Healthcare Power of Attorney recommended for all adults, regardless of age or health.  Vaccines  Flu vaccine: recommended for almost everyone, every fall.   Shingles vaccine: Shingrix recommended after age 15.   Pneumonia vaccines: Prevnar and Pneumovax recommended after age 62, or sooner if certain medical conditions.  Tetanus booster: Tdap recommended every 10 years. Due 2027 Cancer screenings   Colon cancer screening: recommended for everyone at age 42-50  Breast cancer screening: surveillance per your oncology team!   Cervical cancer screening: Pap per OBGYN since last one had some abnormal cells though no cancer or precancer.   Lung cancer screening: not needed for non-smokers  Infection screenings . HIV: recommended screening at least once age 60-65, more often as needed. . Gonorrhea/Chlamydia: screening as needed . Hepatitis C: recommended for anyone born 31-1966 . TB: certain at-risk populations, or depending on work requirements and/or travel history Other . Bone Density Test: recommended for women at age 53

## 2019-09-27 LAB — COMPLETE METABOLIC PANEL WITH GFR
AG Ratio: 2.3 (calc) (ref 1.0–2.5)
ALT: 10 U/L (ref 6–29)
AST: 13 U/L (ref 10–35)
Albumin: 4.6 g/dL (ref 3.6–5.1)
Alkaline phosphatase (APISO): 107 U/L (ref 31–125)
BUN: 10 mg/dL (ref 7–25)
CO2: 28 mmol/L (ref 20–32)
Calcium: 9.8 mg/dL (ref 8.6–10.2)
Chloride: 104 mmol/L (ref 98–110)
Creat: 0.72 mg/dL (ref 0.50–1.10)
GFR, Est African American: 114 mL/min/{1.73_m2} (ref >=60)
GFR, Est Non African American: 98 mL/min/{1.73_m2} (ref >=60)
Globulin: 2 g/dL (calc) (ref 1.9–3.7)
Glucose, Bld: 87 mg/dL (ref 65–99)
Potassium: 4.3 mmol/L (ref 3.5–5.3)
Sodium: 142 mmol/L (ref 135–146)
Total Bilirubin: 0.6 mg/dL (ref 0.2–1.2)
Total Protein: 6.6 g/dL (ref 6.1–8.1)

## 2019-09-27 LAB — CBC
HCT: 37.7 % (ref 35.0–45.0)
Hemoglobin: 12.7 g/dL (ref 11.7–15.5)
MCH: 28.5 pg (ref 27.0–33.0)
MCHC: 33.7 g/dL (ref 32.0–36.0)
MCV: 84.5 fL (ref 80.0–100.0)
MPV: 11 fL (ref 7.5–12.5)
Platelets: 244 10*3/uL (ref 140–400)
RBC: 4.46 10*6/uL (ref 3.80–5.10)
RDW: 15.9 % — ABNORMAL HIGH (ref 11.0–15.0)
WBC: 5.6 10*3/uL (ref 3.8–10.8)

## 2019-09-27 LAB — LIPID PANEL
Cholesterol: 198 mg/dL (ref <200)
HDL: 56 mg/dL (ref >=50)
LDL Cholesterol (Calc): 126 mg/dL (calc) — ABNORMAL HIGH
Non-HDL Cholesterol (Calc): 142 mg/dL (calc) — ABNORMAL HIGH (ref <130)
Total CHOL/HDL Ratio: 3.5 (calc) (ref <5.0)
Triglycerides: 66 mg/dL (ref <150)

## 2019-10-07 ENCOUNTER — Encounter: Payer: Self-pay | Admitting: Osteopathic Medicine

## 2019-10-08 LAB — HM MAMMOGRAPHY

## 2019-11-04 DIAGNOSIS — Z79811 Long term (current) use of aromatase inhibitors: Secondary | ICD-10-CM | POA: Insufficient documentation

## 2019-11-15 ENCOUNTER — Encounter: Payer: Self-pay | Admitting: Osteopathic Medicine

## 2019-11-27 ENCOUNTER — Encounter: Payer: Self-pay | Admitting: Osteopathic Medicine

## 2019-12-28 ENCOUNTER — Telehealth: Payer: Self-pay

## 2019-12-28 NOTE — Telephone Encounter (Signed)
Individuals with recent, documented SARS-CoV-2 infection (including those who are diagnosed following the first vaccine dose) should have recovered from acute infection and met criteria for discontinuation of isolation precautions before receiving the vaccine (either the initial dose or the second dose of a two-dose series) <-- from UpToDate

## 2019-12-28 NOTE — Telephone Encounter (Signed)
Patient advised of recommendations.  

## 2019-12-28 NOTE — Telephone Encounter (Signed)
Diane Warren states she received the Covid vaccine the first of April. She is due for the 2 nd Covid vaccine. She has now tested positive for Covid-19. How long does she need to wait to receive the second dose of the Pfizer Covid vaccine.

## 2020-02-12 ENCOUNTER — Encounter: Payer: Self-pay | Admitting: Osteopathic Medicine

## 2020-09-23 ENCOUNTER — Encounter: Payer: Managed Care, Other (non HMO) | Admitting: Osteopathic Medicine

## 2020-11-26 ENCOUNTER — Encounter: Payer: Managed Care, Other (non HMO) | Admitting: Osteopathic Medicine

## 2021-01-01 ENCOUNTER — Other Ambulatory Visit: Payer: Self-pay

## 2021-01-01 ENCOUNTER — Ambulatory Visit (INDEPENDENT_AMBULATORY_CARE_PROVIDER_SITE_OTHER): Payer: BLUE CROSS/BLUE SHIELD | Admitting: Osteopathic Medicine

## 2021-01-01 ENCOUNTER — Encounter: Payer: Self-pay | Admitting: Osteopathic Medicine

## 2021-01-01 VITALS — BP 164/98 | HR 65 | Temp 98.7°F | Wt 177.1 lb

## 2021-01-01 DIAGNOSIS — R030 Elevated blood-pressure reading, without diagnosis of hypertension: Secondary | ICD-10-CM | POA: Insufficient documentation

## 2021-01-01 DIAGNOSIS — C50919 Malignant neoplasm of unspecified site of unspecified female breast: Secondary | ICD-10-CM | POA: Diagnosis not present

## 2021-01-01 DIAGNOSIS — Z170 Estrogen receptor positive status [ER+]: Secondary | ICD-10-CM

## 2021-01-01 DIAGNOSIS — Z0000 Encounter for general adult medical examination without abnormal findings: Secondary | ICD-10-CM

## 2021-01-01 NOTE — Patient Instructions (Signed)
General Preventive Care  Most recent routine screening labs: ordered.   Blood pressure goal 130/80 or less.   Tobacco: don't!  Alcohol: responsible moderation is ok for most adults - if you have concerns about your alcohol intake, please talk to me!   Exercise: as tolerated to reduce risk of cardiovascular disease and diabetes. Strength training will also prevent osteoporosis.   Mental health: if need for mental health care (medicines, counseling, other), or concerns about moods, please let me know!   Sexual / Reproductive health: if need for STI testing, or if concerns with libido/pain problems, please let me know!   Advanced Directive: Living Will and/or Healthcare Power of Attorney recommended for all adults, regardless of age or health.  Vaccines  Flu vaccine: fall/winter annually   Shingles vaccine: after age 41. Due for Shingrix #2 anytime   Pneumonia vaccines: booster after age 3  Tetanus booster: every 10 years, due 2027  COVID vaccine: THANKS for getting your vaccine! :) Cancer screenings   Colon cancer screening: repeat due 02/2030  Breast cancer surveillance: per oncology  Cervical cancer screening: Pap per OBGYN   Lung cancer screening: not needed for non-smokers  Infection screenings  . HIV: recommended screening at least once age 83-65 . Gonorrhea/Chlamydia and other STI: screening as needed . Hepatitis C: recommended once for everyone age 43-75 . TB: certain at-risk populations, or depending on work requirements and/or travel history Other . Bone Density Test: recommended for women at age 54

## 2021-01-01 NOTE — Progress Notes (Signed)
Diane Warren is a 50 y.o. female who presents to  [facility]  today, 01/01/21, seeking care for the following:  . Annual physical  . White coat syndrome - home BP 130s SBP max, usually DBP 70s, no CP/SOB.  . Following w/ oncology for breast CA, noted some leg cramps and mood changes after starting hormone blockers but not too bothersome  BP Readings from Last 3 Encounters:  01/01/21 (!) 164/98  09/26/19 133/86  09/08/19 (!) 166/91      ASSESSMENT & PLAN with other pertinent findings:  The primary encounter diagnosis was Annual physical exam. 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   1. Annual physical exam See below  2. 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 Borderline blood pressure 4. White coat syndrome without diagnosis of hypertension montior at home, as long as at/under goal ok    Patient Instructions  General Preventive Care  Most recent routine screening labs: ordered.   Blood pressure goal 130/80 or less.   Tobacco: don't!  Alcohol: responsible moderation is ok for most adults - if you have concerns about your alcohol intake, please talk to me!   Exercise: as tolerated to reduce risk of cardiovascular disease and diabetes. Strength training will also prevent osteoporosis.   Mental health: if need for mental health care (medicines, counseling, other), or concerns about moods, please let me know!   Sexual / Reproductive health: if need for STI testing, or if concerns with libido/pain problems, please let me know!   Advanced Directive: Living Will and/or Healthcare Power of  Attorney recommended for all adults, regardless of age or health.  Vaccines  Flu vaccine: fall/winter annually   Shingles vaccine: after age 48. Due for Shingrix #2 anytime   Pneumonia vaccines: booster after age 32  Tetanus booster: every 10 years, due 2027  COVID vaccine: THANKS for getting your vaccine! :) Cancer screenings   Colon cancer screening: repeat due 02/2030  Breast cancer surveillance: per oncology  Cervical cancer screening: Pap per OBGYN   Lung cancer screening: not needed for non-smokers  Infection screenings  . HIV: recommended screening at least once age 87-65 . Gonorrhea/Chlamydia and other STI: screening as needed . Hepatitis C: recommended once for everyone age 39-75 . TB: certain at-risk populations, or depending on work requirements and/or travel history Other . Bone Density Test: recommended for women at age 71   Orders Placed This Encounter  Procedures  . CBC  . COMPLETE METABOLIC PANEL WITH GFR  . Lipid panel  . TSH    No orders of the defined types were placed in this encounter.    See below for relevant physical exam findings  See below for recent lab and imaging results reviewed  Medications, allergies, PMH, PSH, SocH, FamH reviewed below    Follow-up instructions: No follow-ups on file.                                        Exam:  BP (!) 164/98 (BP Location: Left Arm, Patient Position: Sitting, Cuff Size: Normal)   Pulse 65   Temp 98.7 F (37.1  C) (Oral)   Wt 177 lb 1.9 oz (80.3 kg)   BMI 34.59 kg/m   Constitutional: VS see above. General Appearance: alert, well-developed, well-nourished, NAD  Neck: No masses, trachea midline.   Respiratory: Normal respiratory effort. no wheeze, no rhonchi, no rales  Cardiovascular: S1/S2 normal, no murmur, no rub/gallop auscultated. RRR.   Musculoskeletal: Gait normal. Symmetric and independent movement of all extremities  Abdominal: non-tender,  non-distended, no appreciable organomegaly, neg Murphy's, BS WNLx4  Neurological: Normal balance/coordination. No tremor.  Skin: warm, dry, intact.   Psychiatric: Normal judgment/insight. Normal mood and affect. Oriented x3.   Current Meds  Medication Sig  . Calcium Carbonate (CALTRATE 600 PO) Take by mouth.  Marland Kitchen exemestane (AROMASIN) 25 MG tablet Take 1 tablet by mouth daily.  . fluticasone (FLONASE) 50 MCG/ACT nasal spray PLACE 1-2 SPRAYS INTO BOTH NOSTRILS DAILY.  Marland Kitchen loratadine (CLARITIN) 10 MG tablet Take by mouth.  . triamcinolone cream (KENALOG) 0.1 % Apply thin layer to affected areas BID    Allergies  Allergen Reactions  . Anastrozole Rash  . Doxycycline Rash and Hives    Patient Active Problem List   Diagnosis Date Noted  . White coat syndrome without diagnosis of hypertension 01/01/2021  . Long term current use of aromatase inhibitor 11/04/2019  . 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 Breast cancer in female [facility]) 12/05/2018  . Migraine headache 12/13/2017  . Borderline blood pressure 12/13/2017  . Perimenopausal 12/13/2017  . Elevated BP without diagnosis of hypertension 07/13/2017  . Strain of hip adductor muscle, initial encounter 07/22/2016  . Encounter for annual physical exam 05/21/2016  . Obesity 12/31/2015  . Right knee pain 09/25/2015  . Right hip pain 09/25/2015  . BMI 32.0-32.9,adult 09/25/2015    Family History  Problem Relation Age of Onset  . Hypertension Mother   . Hyperlipidemia Mother   . Cancer Mother        Uterine Cancer  . Cancer Maternal Uncle        LUNG  . Cancer Paternal Uncle        SKIN  . Cancer Maternal Grandmother        BREAST  . Cancer Paternal Grandmother        BREAST  . Diabetes Paternal Grandmother     Social History   Tobacco Use  Smoking Status Never Smoker  Smokeless Tobacco Never Used    Past Surgical History:  Procedure Laterality Date  . BREAST SURGERY    . BUNIONECTOMY Right 2004  . CESAREAN  SECTION    . TONSILLECTOMY    . TUBAL LIGATION      Immunization History  Administered Date(s) Administered  . Influenza Split 06/10/2020  . Influenza, Seasonal, Injecte, Preservative Fre 05/20/2014  . Influenza,inj,Quad PF,6+ Mos 07/15/2015, 05/21/2016, 06/29/2017, 06/19/2018, 05/31/2019  . Influenza,inj,quad, With Preservative 07/15/2015  . Influenza-Unspecified 07/15/2015, 05/31/2019  . PFIZER(Purple Top)SARS-COV-2 Vaccination 12/10/2019, 01/20/2020, 05/23/2020  . Pneumococcal Polysaccharide-23 12/06/2018  . Tdap 07/22/2016  . Zoster 08/09/2020    Recent Results (from the past 2160 hour(s))  CBC     Status: None   Collection Time: 01/01/21 12:00 AM  Result Value Ref Range   WBC 6.9 3.8 - 10.8 Thousand/uL   RBC 4.56 3.80 - 5.10 Million/uL   Hemoglobin 13.5 11.7 - 15.5 g/dL   HCT 40.0 35.0 - 45.0 %   MCV 87.7 80.0 - 100.0 fL   MCH 29.6 27.0 - 33.0 pg   MCHC 33.8 32.0 - 36.0 g/dL   RDW  13.5 11.0 - 15.0 %   Platelets 236 140 - 400 Thousand/uL   MPV 11.2 7.5 - 12.5 fL    No results found.     All questions at time of visit were answered - patient instructed to contact office with any additional concerns or updates. ER/RTC precautions were reviewed with the patient as applicable.   Please note: manual typing as well as voice recognition software may have been used to produce this document - typos may escape review. Please contact Dr. Sheppard Coil for any needed clarifications.

## 2021-01-02 ENCOUNTER — Encounter: Payer: Self-pay | Admitting: Osteopathic Medicine

## 2021-01-02 LAB — CBC
HCT: 40 % (ref 35.0–45.0)
Hemoglobin: 13.5 g/dL (ref 11.7–15.5)
MCH: 29.6 pg (ref 27.0–33.0)
MCHC: 33.8 g/dL (ref 32.0–36.0)
MCV: 87.7 fL (ref 80.0–100.0)
MPV: 11.2 fL (ref 7.5–12.5)
Platelets: 236 10*3/uL (ref 140–400)
RBC: 4.56 10*6/uL (ref 3.80–5.10)
RDW: 13.5 % (ref 11.0–15.0)
WBC: 6.9 10*3/uL (ref 3.8–10.8)

## 2021-01-02 LAB — COMPLETE METABOLIC PANEL WITH GFR
AG Ratio: 2.2 (calc) (ref 1.0–2.5)
ALT: 14 U/L (ref 6–29)
AST: 17 U/L (ref 10–35)
Albumin: 4.8 g/dL (ref 3.6–5.1)
Alkaline phosphatase (APISO): 126 U/L (ref 37–153)
BUN: 11 mg/dL (ref 7–25)
CO2: 30 mmol/L (ref 20–32)
Calcium: 10.1 mg/dL (ref 8.6–10.4)
Chloride: 105 mmol/L (ref 98–110)
Creat: 0.79 mg/dL (ref 0.50–1.05)
GFR, Est African American: 100 mL/min/{1.73_m2} (ref >=60)
GFR, Est Non African American: 87 mL/min/{1.73_m2} (ref >=60)
Globulin: 2.2 g/dL (calc) (ref 1.9–3.7)
Glucose, Bld: 88 mg/dL (ref 65–99)
Potassium: 4.2 mmol/L (ref 3.5–5.3)
Sodium: 142 mmol/L (ref 135–146)
Total Bilirubin: 0.6 mg/dL (ref 0.2–1.2)
Total Protein: 7 g/dL (ref 6.1–8.1)

## 2021-01-02 LAB — LIPID PANEL
Cholesterol: 217 mg/dL — ABNORMAL HIGH (ref <200)
HDL: 48 mg/dL — ABNORMAL LOW (ref >=50)
LDL Cholesterol (Calc): 149 mg/dL (calc) — ABNORMAL HIGH
Non-HDL Cholesterol (Calc): 169 mg/dL (calc) — ABNORMAL HIGH (ref <130)
Total CHOL/HDL Ratio: 4.5 (calc) (ref <5.0)
Triglycerides: 92 mg/dL (ref <150)

## 2021-01-02 LAB — TSH: TSH: 1.38 mIU/L

## 2021-01-31 ENCOUNTER — Emergency Department (INDEPENDENT_AMBULATORY_CARE_PROVIDER_SITE_OTHER)
Admission: EM | Admit: 2021-01-31 | Discharge: 2021-01-31 | Disposition: A | Discharge status: Home / Self Care | Payer: BLUE CROSS/BLUE SHIELD | Source: Home / Self Care

## 2021-01-31 ENCOUNTER — Encounter: Payer: Self-pay | Admitting: Emergency Medicine

## 2021-01-31 DIAGNOSIS — J019 Acute sinusitis, unspecified: Secondary | ICD-10-CM | POA: Diagnosis not present

## 2021-01-31 DIAGNOSIS — H6121 Impacted cerumen, right ear: Secondary | ICD-10-CM | POA: Diagnosis not present

## 2021-01-31 DIAGNOSIS — J3489 Other specified disorders of nose and nasal sinuses: Secondary | ICD-10-CM | POA: Diagnosis not present

## 2021-01-31 DIAGNOSIS — B9689 Other specified bacterial agents as the cause of diseases classified elsewhere: Secondary | ICD-10-CM

## 2021-01-31 DIAGNOSIS — H6122 Impacted cerumen, left ear: Secondary | ICD-10-CM

## 2021-01-31 DIAGNOSIS — J309 Allergic rhinitis, unspecified: Secondary | ICD-10-CM

## 2021-01-31 MED ORDER — METHYLPREDNISOLONE ACETATE 80 MG/ML IJ SUSP
80.0000 mg | Freq: Once | INTRAMUSCULAR | Status: AC
  Administered 2021-01-31: 80 mg via INTRAMUSCULAR

## 2021-01-31 MED ORDER — PREDNISONE 20 MG PO TABS: ORAL_TABLET | ORAL | 0 refills | Status: AC

## 2021-01-31 MED ORDER — FEXOFENADINE HCL 180 MG PO TABS: 180.0000 mg | ORAL_TABLET | Freq: Every day | ORAL | 0 refills | Status: AC

## 2021-01-31 MED ORDER — AMOXICILLIN-POT CLAVULANATE 875-125 MG PO TABS: 1.0000 | ORAL_TABLET | Freq: Two times a day (BID) | ORAL | 0 refills | Status: AC

## 2021-01-31 NOTE — ED Triage Notes (Signed)
Patient c/o sinus drainage causing sore throat, ear pressure, some swelling under eyes, head congestion.  Patient has taken Zicam, Mucinex and Tylenol.  Patient is vaccinated for COVID.

## 2021-01-31 NOTE — ED Provider Notes (Signed)
[name]    CSN: [id number] Arrival date & time: 01/31/21  5885      History   Chief Complaint Chief Complaint  Patient presents with  . Sore Throat  . Facial Pain    HPI Diane Warren is a 50 y.o. female.   HPI 50 year old female presents with sinus drainage, sore throat, ear pressure, head congestion, and swelling under her eyes for 2 days.  Patient is vaccinated for COVID-19.  Denies fever or sick contacts.  Past Medical History:  Diagnosis Date  . Cancer ([place])   . Migraine headache 12/13/2017  . Perimenopausal 12/13/2017    Patient Active Problem List   Diagnosis Date Noted  . White coat syndrome without diagnosis of hypertension 01/01/2021  . Long term current use of aromatase inhibitor 11/04/2019  . 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 Breast cancer in female [facility]) 12/05/2018  . Migraine headache 12/13/2017  . Borderline blood pressure 12/13/2017  . Perimenopausal 12/13/2017  . Elevated BP without diagnosis of hypertension 07/13/2017  . Strain of hip adductor muscle, initial encounter 07/22/2016  . Encounter for annual physical exam 05/21/2016  . Obesity 12/31/2015  . Right knee pain 09/25/2015  . Right hip pain 09/25/2015  . BMI 32.0-32.9,adult 09/25/2015    Past Surgical History:  Procedure Laterality Date  . BREAST SURGERY    . BUNIONECTOMY Right 2004  . CESAREAN SECTION    . TONSILLECTOMY    . TUBAL LIGATION      OB History   No obstetric history on file.      Home Medications    Prior to Admission medications   Medication Sig Start Date End Date Taking? Authorizing Provider  amoxicillin-clavulanate (AUGMENTIN) 875-125 MG tablet Take 1 tablet by mouth every 12 (twelve) hours. 01/31/21  Yes Eliezer Lofts, FNP  Calcium Carbonate (CALTRATE 600 PO) Take by mouth.   Yes [provider]  exemestane (AROMASIN) 25 MG tablet Take 1 tablet by mouth daily. 08/04/20  Yes [provider]  fexofenadine (ALLEGRA  ALLERGY) 180 MG tablet Take 1 tablet (180 mg total) by mouth daily for 15 days. 01/31/21 02/15/21 Yes Eliezer Lofts, FNP  fluticasone (FLONASE) 50 MCG/ACT nasal spray PLACE 1-2 SPRAYS INTO BOTH NOSTRILS DAILY. 08/27/19  Yes Emeterio Reeve, DO  predniSONE (DELTASONE) 20 MG tablet Take 3 tabs PO daily x 5 days. 01/31/21  Yes Eliezer Lofts, FNP  loratadine (CLARITIN) 10 MG tablet Take by mouth.  01/31/21 Yes [provider]  triamcinolone cream (KENALOG) 0.1 % Apply thin layer to affected areas BID 09/08/19   Kandra Nicolas, MD    Family History Family History  Problem Relation Age of Onset  . Hypertension Mother   . Hyperlipidemia Mother   . Cancer Mother        Uterine Cancer  . Cancer Maternal Uncle        LUNG  . Cancer Paternal Uncle        SKIN  . Cancer Maternal Grandmother        BREAST  . Cancer Paternal Grandmother        BREAST  . Diabetes Paternal Grandmother     Social History Social History   Tobacco Use  . Smoking status: Never Smoker  . Smokeless tobacco: Never Used  Substance Use Topics  . Alcohol use: No     Allergies   Anastrozole and Doxycycline   Review of Systems Review of Systems  Constitutional: Negative.   HENT: Positive for congestion, ear pain,  facial swelling, sinus pressure, sinus pain and sore throat.   Eyes: Negative.   Respiratory: Negative.   Cardiovascular: Negative.   Gastrointestinal: Negative.   Genitourinary: Negative.   Musculoskeletal: Negative.   Skin: Negative.      Physical Exam Triage Vital Signs ED Triage Vitals  Enc Vitals Group     BP 01/31/21 0847 (!) 170/101     Pulse Rate 01/31/21 0847 67     Resp --      Temp 01/31/21 0847 99 F (37.2 C)     Temp Source 01/31/21 0847 Oral     SpO2 01/31/21 0847 99 %     Weight --      Height --      Head Circumference --      Peak Flow --      Pain Score 01/31/21 0848 5     Pain Loc --      Pain Edu? --      Excl. in [place]? --    No data found.  Updated  Vital Signs BP (!) 170/101 (BP Location: Left Arm)   Pulse 67   Temp 99 F (37.2 C) (Oral)   SpO2 99%      Physical Exam Vitals and nursing note reviewed.  Constitutional:      General: She is not in acute distress.    Appearance: She is well-developed and normal weight. She is ill-appearing.  HENT:     Head: Normocephalic and atraumatic.     Left Ear: Tympanic membrane is retracted.     Ears:     Comments: Left EAC: Clear, left TM: Clear, retracted with light reflex Right EAC: Impacted cerumen unable to visualize right TM  Post right EAC ear lavage: Mildly erythematous, right TM: Clear, retracted tiny~2-3 mm circular shaped older perforation noted     Nose:     Right Turbinates: Enlarged.     Left Turbinates: Enlarged.     Right Sinus: Maxillary sinus tenderness present.     Left Sinus: Maxillary sinus tenderness present.     Comments: Turbinates are erythematous, TTP over maxillary sinuses bilaterally    Mouth/Throat:     Mouth: Mucous membranes are moist. No oral lesions.     Pharynx: Oropharynx is clear. No pharyngeal swelling, oropharyngeal exudate, posterior oropharyngeal erythema or uvula swelling.     Comments: Moderate amount of clear drainage of posterior oropharynx noted Cardiovascular:     Rate and Rhythm: Normal rate and regular rhythm.     Pulses: Normal pulses.     Heart sounds: Normal heart sounds. No murmur heard.   Pulmonary:     Effort: Pulmonary effort is normal.     Breath sounds: Normal breath sounds.     Comments: No adventitious breath sounds Musculoskeletal:        General: Normal range of motion.     Cervical back: Normal range of motion. Tenderness present.  Lymphadenopathy:     Cervical: Cervical adenopathy present.  Skin:    General: Skin is warm and dry.  Neurological:     General: No focal deficit present.     Mental Status: She is alert and oriented to person, place, and time.  Psychiatric:        Mood and Affect: Mood normal.         Behavior: Behavior normal.      UC Treatments / Results  Labs (all labs ordered are listed, but only abnormal results are displayed) Labs Reviewed - No data  to display  EKG   Radiology No results found.  Procedures Procedures (including critical care time)  Medications Ordered in UC Medications  methylPREDNISolone acetate (DEPO-MEDROL) injection 80 mg (80 mg Intramuscular Given 01/31/21 1041)    Initial Impression / Assessment and Plan / UC Course  I have reviewed the triage vital signs and the nursing notes.  Pertinent labs & imaging results that were available during my care of the patient were reviewed by me and considered in my medical decision making (see chart for details).     MDM: 1. Left cerumen impaction, 2.  Acute bacterial rhinosinusitis, 3.  Sinus pressure, 4.  Allergic rhinitis.  Patient discharged home, hemodynamically stable. Final Clinical Impressions(s) / UC Diagnoses   Final diagnoses:  Impacted cerumen of left ear  Acute bacterial rhinosinusitis  Sinus pressure  Allergic rhinitis, unspecified seasonality, unspecified trigger     Discharge Instructions     Advised/instructed patient to take medication as directed with food to completion.  Instructed patient not to start oral prednisone burst until tomorrow, Sunday, 02/01/2021.  Advised patient to take Allegra 180 mg daily for the next 5 days, then as needed for concurrent postnasal drip/drainage.  Encouraged patient increase daily water intake while taking these medications.    ED Prescriptions    Medication Sig Dispense Auth. Provider   amoxicillin-clavulanate (AUGMENTIN) 875-125 MG tablet Take 1 tablet by mouth every 12 (twelve) hours. 14 tablet Eliezer Lofts, FNP   predniSONE (DELTASONE) 20 MG tablet Take 3 tabs PO daily x 5 days. 15 tablet Eliezer Lofts, FNP   fexofenadine [facility] ALLERGY) 180 MG tablet Take 1 tablet (180 mg total) by mouth daily for 15 days. 15 tablet Eliezer Lofts, FNP      PDMP not reviewed this encounter.   Eliezer Lofts, [place] 01/31/21 1054

## 2021-01-31 NOTE — Discharge Instructions (Addendum)
Advised/instructed patient to take medication as directed with food to completion.  Instructed patient not to start oral prednisone burst until tomorrow, Sunday, 02/01/2021.  Advised patient to take Allegra 180 mg daily for the next 5 days, then as needed for concurrent postnasal drip/drainage.  Encouraged patient increase daily water intake while taking these medications.

## 2021-02-01 ENCOUNTER — Ambulatory Visit: Payer: Self-pay

## 2021-04-04 ENCOUNTER — Encounter: Payer: Self-pay | Admitting: Osteopathic Medicine
